# Patient Record
Sex: Female | Born: 1960 | Race: White | Hispanic: No | Marital: Married | Smoking: Current every day smoker
Health system: Southern US, Community
[De-identification: ages and names within clinical notes are randomized; demographics above are authoritative.]

## PROBLEM LIST (undated history)

## (undated) DIAGNOSIS — I779 Disorder of arteries and arterioles, unspecified: Secondary | ICD-10-CM

## (undated) DIAGNOSIS — J4530 Mild persistent asthma, uncomplicated: Secondary | ICD-10-CM

## (undated) DIAGNOSIS — E119 Type 2 diabetes mellitus without complications: Secondary | ICD-10-CM

## (undated) DIAGNOSIS — Z973 Presence of spectacles and contact lenses: Secondary | ICD-10-CM

## (undated) DIAGNOSIS — S86012A Strain of left Achilles tendon, initial encounter: Secondary | ICD-10-CM

## (undated) DIAGNOSIS — D259 Leiomyoma of uterus, unspecified: Secondary | ICD-10-CM

## (undated) DIAGNOSIS — R112 Nausea with vomiting, unspecified: Secondary | ICD-10-CM

## (undated) DIAGNOSIS — F419 Anxiety disorder, unspecified: Secondary | ICD-10-CM

## (undated) DIAGNOSIS — Z8673 Personal history of transient ischemic attack (TIA), and cerebral infarction without residual deficits: Secondary | ICD-10-CM

## (undated) DIAGNOSIS — N200 Calculus of kidney: Secondary | ICD-10-CM

## (undated) DIAGNOSIS — Z87442 Personal history of urinary calculi: Secondary | ICD-10-CM

## (undated) DIAGNOSIS — Z9889 Other specified postprocedural states: Secondary | ICD-10-CM

## (undated) DIAGNOSIS — J45909 Unspecified asthma, uncomplicated: Secondary | ICD-10-CM

## (undated) DIAGNOSIS — E282 Polycystic ovarian syndrome: Secondary | ICD-10-CM

## (undated) DIAGNOSIS — I10 Essential (primary) hypertension: Secondary | ICD-10-CM

## (undated) DIAGNOSIS — J189 Pneumonia, unspecified organism: Secondary | ICD-10-CM

## (undated) DIAGNOSIS — Z9289 Personal history of other medical treatment: Secondary | ICD-10-CM

## (undated) DIAGNOSIS — G459 Transient cerebral ischemic attack, unspecified: Secondary | ICD-10-CM

## (undated) DIAGNOSIS — F329 Major depressive disorder, single episode, unspecified: Secondary | ICD-10-CM

## (undated) DIAGNOSIS — E782 Mixed hyperlipidemia: Secondary | ICD-10-CM

## (undated) DIAGNOSIS — M1990 Unspecified osteoarthritis, unspecified site: Secondary | ICD-10-CM

## (undated) DIAGNOSIS — K219 Gastro-esophageal reflux disease without esophagitis: Secondary | ICD-10-CM

## (undated) DIAGNOSIS — Z8489 Family history of other specified conditions: Secondary | ICD-10-CM

## (undated) HISTORY — PX: UPPER GI ENDOSCOPY: SHX6162

## (undated) HISTORY — DX: Essential (primary) hypertension: I10

## (undated) HISTORY — PX: UMBILICAL HERNIA REPAIR: SHX196

## (undated) HISTORY — PX: TONSILLECTOMY: SUR1361

## (undated) HISTORY — DX: Polycystic ovarian syndrome: E28.2

## (undated) HISTORY — DX: Unspecified osteoarthritis, unspecified site: M19.90

---

## 2008-09-27 ENCOUNTER — Other Ambulatory Visit: Admission: RE | Admit: 2008-09-27 | Discharge: 2008-09-27 | Payer: Self-pay | Admitting: Obstetrics and Gynecology

## 2009-04-12 ENCOUNTER — Encounter: Admission: RE | Admit: 2009-04-12 | Discharge: 2009-04-12 | Payer: Self-pay | Admitting: Obstetrics and Gynecology

## 2011-03-25 ENCOUNTER — Other Ambulatory Visit: Payer: Self-pay | Admitting: Gynecology

## 2011-03-25 ENCOUNTER — Ambulatory Visit (INDEPENDENT_AMBULATORY_CARE_PROVIDER_SITE_OTHER): Payer: BC Managed Care – PPO | Admitting: Gynecology

## 2011-03-25 DIAGNOSIS — N920 Excessive and frequent menstruation with regular cycle: Secondary | ICD-10-CM

## 2011-03-25 DIAGNOSIS — Z01419 Encounter for gynecological examination (general) (routine) without abnormal findings: Secondary | ICD-10-CM

## 2011-03-25 DIAGNOSIS — R635 Abnormal weight gain: Secondary | ICD-10-CM

## 2011-03-31 ENCOUNTER — Other Ambulatory Visit: Payer: Self-pay | Admitting: Gynecology

## 2011-03-31 ENCOUNTER — Other Ambulatory Visit: Payer: BC Managed Care – PPO

## 2011-03-31 ENCOUNTER — Other Ambulatory Visit (HOSPITAL_COMMUNITY)
Admission: RE | Admit: 2011-03-31 | Discharge: 2011-03-31 | Disposition: A | Discharge status: Home / Self Care | Payer: BC Managed Care – PPO | Source: Ambulatory Visit | Attending: Gynecology | Admitting: Gynecology

## 2011-03-31 ENCOUNTER — Ambulatory Visit (INDEPENDENT_AMBULATORY_CARE_PROVIDER_SITE_OTHER): Payer: BC Managed Care – PPO | Admitting: Gynecology

## 2011-03-31 DIAGNOSIS — N83209 Unspecified ovarian cyst, unspecified side: Secondary | ICD-10-CM

## 2011-03-31 DIAGNOSIS — Z124 Encounter for screening for malignant neoplasm of cervix: Secondary | ICD-10-CM | POA: Insufficient documentation

## 2011-03-31 DIAGNOSIS — N7013 Chronic salpingitis and oophoritis: Secondary | ICD-10-CM

## 2011-03-31 DIAGNOSIS — N920 Excessive and frequent menstruation with regular cycle: Secondary | ICD-10-CM

## 2011-06-23 ENCOUNTER — Ambulatory Visit (INDEPENDENT_AMBULATORY_CARE_PROVIDER_SITE_OTHER): Payer: BC Managed Care – PPO | Admitting: Gynecology

## 2011-06-23 DIAGNOSIS — Z30431 Encounter for routine checking of intrauterine contraceptive device: Secondary | ICD-10-CM

## 2011-07-16 ENCOUNTER — Encounter: Payer: Self-pay | Admitting: Anesthesiology

## 2011-07-23 ENCOUNTER — Ambulatory Visit: Payer: BC Managed Care – PPO | Admitting: Gynecology

## 2012-09-03 ENCOUNTER — Other Ambulatory Visit: Payer: Self-pay | Admitting: Gynecology

## 2012-09-03 DIAGNOSIS — Z1231 Encounter for screening mammogram for malignant neoplasm of breast: Secondary | ICD-10-CM

## 2012-10-11 ENCOUNTER — Ambulatory Visit
Admission: RE | Admit: 2012-10-11 | Discharge: 2012-10-11 | Disposition: A | Discharge status: Home / Self Care | Payer: BC Managed Care – PPO | Source: Ambulatory Visit | Attending: Gynecology | Admitting: Gynecology

## 2012-10-11 ENCOUNTER — Encounter: Payer: Self-pay | Admitting: Gynecology

## 2012-10-11 ENCOUNTER — Ambulatory Visit (INDEPENDENT_AMBULATORY_CARE_PROVIDER_SITE_OTHER): Payer: BC Managed Care – PPO | Admitting: Gynecology

## 2012-10-11 ENCOUNTER — Encounter: Payer: BC Managed Care – PPO | Admitting: Gynecology

## 2012-10-11 VITALS — BP 114/78 | Ht 63.0 in | Wt 277.0 lb

## 2012-10-11 DIAGNOSIS — N7011 Chronic salpingitis: Secondary | ICD-10-CM

## 2012-10-11 DIAGNOSIS — D219 Benign neoplasm of connective and other soft tissue, unspecified: Secondary | ICD-10-CM

## 2012-10-11 DIAGNOSIS — Z1231 Encounter for screening mammogram for malignant neoplasm of breast: Secondary | ICD-10-CM

## 2012-10-11 DIAGNOSIS — Z23 Encounter for immunization: Secondary | ICD-10-CM

## 2012-10-11 DIAGNOSIS — N7013 Chronic salpingitis and oophoritis: Secondary | ICD-10-CM

## 2012-10-11 DIAGNOSIS — N83201 Unspecified ovarian cyst, right side: Secondary | ICD-10-CM

## 2012-10-11 DIAGNOSIS — N83209 Unspecified ovarian cyst, unspecified side: Secondary | ICD-10-CM

## 2012-10-11 DIAGNOSIS — I10 Essential (primary) hypertension: Secondary | ICD-10-CM | POA: Insufficient documentation

## 2012-10-11 DIAGNOSIS — Z01419 Encounter for gynecological examination (general) (routine) without abnormal findings: Secondary | ICD-10-CM

## 2012-10-11 DIAGNOSIS — E119 Type 2 diabetes mellitus without complications: Secondary | ICD-10-CM | POA: Insufficient documentation

## 2012-10-11 DIAGNOSIS — D259 Leiomyoma of uterus, unspecified: Secondary | ICD-10-CM

## 2012-10-11 NOTE — Progress Notes (Signed)
Lydia [name] [birth year] [id number]   History:    51 y.o.  for ann2ual gyn exam with history of fibroid uterus. She had a Mirena IUD placed in 2012 to help with her dysmenorrhea and menorrhagia and states her cycles are very light infrequent. Review of her record indicated that in April 2012 her ultrasound demonstrated that she had 3 intramural myomas the largest one measuring 39 x 47 x 50 mm. She did have a right echo-free cyst measuring 35 x 18 x 29 mm negative color flow. Left ovary had a tubular cystic mass negative color flow measuring 26 x 27 x 12 mm and her sonohysterogram showed no intracavitary defect. Patient also had had an endometrial biopsy the week prior and had a benign endometrium and then shortly thereafter had the Mirena IUD placed. She did not followup after the IUD was placed or for followup ultrasound on the ovarian cyst. Patient had a normal Pap smear in 2012. Her mammogram was earlier today. She frequently does her self breast examination.  Past medical history,surgical history, family history and social history were all reviewed and documented in the EPIC chart.  Gynecologic History No LMP recorded. Patient is not currently having periods (Reason: IUD). Contraception: IUD Last Pap: 2012. Results were: normal Last mammogram: 2013. Results were: Done today results pending  Obstetric History OB History    Grav Para Term Preterm Abortions TAB SAB Ect Mult Living   1 1  1      1      # Outc Date GA Lbr Len/2nd Wgt Sex Del Anes PTL Lv   1 PRE     M CS  Yes Yes       ROS: A ROS was performed and pertinent positives and negatives are included in the history.  GENERAL: No fevers or chills. HEENT: No change in vision, no earache, sore throat or sinus congestion. NECK: No pain or stiffness. CARDIOVASCULAR: No chest pain or pressure. No palpitations. PULMONARY: No shortness of breath, cough or wheeze. GASTROINTESTINAL: No abdominal pain, nausea, vomiting or diarrhea, melena or  bright red blood per rectum. GENITOURINARY: No urinary frequency, urgency, hesitancy or dysuria. MUSCULOSKELETAL: No joint or muscle pain, no back pain, no recent trauma. DERMATOLOGIC: No rash, no itching, no lesions. ENDOCRINE: No polyuria, polydipsia, no heat or cold intolerance. No recent change in weight. HEMATOLOGICAL: No anemia or easy bruising or bleeding. NEUROLOGIC: No headache, seizures, numbness, tingling or weakness. PSYCHIATRIC: No depression, no loss of interest in normal activity or change in sleep pattern.     Exam: chaperone present  BP 114/78  Ht 5\' 3"  (1.6 m)  Wt 277 lb (125.646 kg)  BMI 49.07 kg/m2  Body mass index is 49.07 kg/(m^2).  General appearance : Well developed well nourished female. No acute distress HEENT: Neck supple, trachea midline, no carotid bruits, no thyroidmegaly Lungs: Clear to auscultation, no rhonchi or wheezes, or rib retractions  Heart: Regular rate and rhythm, no murmurs or gallops Breast:Examined in sitting and supine position were symmetrical in appearance, no palpable masses or tenderness,  no skin retraction, no nipple inversion, no nipple discharge, no skin discoloration, no axillary or supraclavicular lymphadenopathy Abdomen: no palpable masses or tenderness, no rebound or guarding Extremities: no edema or skin discoloration or tenderness  Pelvic:  Bartholin, Urethra, Skene Glands: Within normal limits             Vagina: No gross lesions or discharge  Cervix: No gross lesions or discharge, IUD string seen  Uterus  six-day week size slightly irregular limited due to patient's abdominal girth  Adnexa  limited due to patient's abdominal girth Anus and perineum  normal   Rectovaginal  normal sphincter tone without palpated masses or tenderness             Hemoccult not done     Assessment/Plan:  51 y.o. female for annual exam will return to the office in 2 weeks for followup overdue ultrasound (fibroids, right ovarian cyst, hydrosalpinx  left?. We discussed the new Pap smear screening guidelines so she will not receive one today. She had the flu vaccine today. Her primary physician in [place] did her lab work recently. There monitor her for her hypertension and type 2 diabetes. She was encouraged to continue to do her monthly self breast examination. We discussed importance of calcium vitamin D for osteoporosis prevention.    Ok Edwards MD, 1:34 PM 10/11/2012

## 2012-10-11 NOTE — Patient Instructions (Signed)

## 2012-10-29 ENCOUNTER — Ambulatory Visit (INDEPENDENT_AMBULATORY_CARE_PROVIDER_SITE_OTHER): Payer: BC Managed Care – PPO | Admitting: Gynecology

## 2012-10-29 ENCOUNTER — Ambulatory Visit (INDEPENDENT_AMBULATORY_CARE_PROVIDER_SITE_OTHER): Payer: BC Managed Care – PPO

## 2012-10-29 ENCOUNTER — Encounter: Payer: Self-pay | Admitting: Gynecology

## 2012-10-29 DIAGNOSIS — Z8742 Personal history of other diseases of the female genital tract: Secondary | ICD-10-CM | POA: Insufficient documentation

## 2012-10-29 DIAGNOSIS — N7013 Chronic salpingitis and oophoritis: Secondary | ICD-10-CM

## 2012-10-29 DIAGNOSIS — D259 Leiomyoma of uterus, unspecified: Secondary | ICD-10-CM

## 2012-10-29 DIAGNOSIS — N83201 Unspecified ovarian cyst, right side: Secondary | ICD-10-CM

## 2012-10-29 DIAGNOSIS — D219 Benign neoplasm of connective and other soft tissue, unspecified: Secondary | ICD-10-CM

## 2012-10-29 DIAGNOSIS — N83209 Unspecified ovarian cyst, unspecified side: Secondary | ICD-10-CM

## 2012-10-29 DIAGNOSIS — N7011 Chronic salpingitis: Secondary | ICD-10-CM

## 2012-10-29 NOTE — Progress Notes (Signed)
Patient is a 51 year old who was seen in the office on November 4 for her annual exam see previous note for details. Review of her record indicated that in April 2012 her ultrasound demonstrated that she had 3 intramural myomas the largest one measuring 39 x 47 x 50 mm. She did have a right echo-free cyst measuring 35 x 18 x 29 mm negative color flow. Left ovary had a tubular cystic mass negative color flow measuring 26 x 27 x 12 mm and her sonohysterogram showed no intracavitary defect. Patient also had had an endometrial biopsy the week prior and had a benign endometrium and then shortly thereafter had the Mirena IUD placed. She did not followup after the IUD was placed or for followup ultrasound on the ovarian cyst. She is here for followup ultrasound.  Ultrasound today: Uterus measured 9.2 x 5.6 x 4.8 cm with endometrial stripe of 5.1 mm. Multiple small fibroids the largest one measuring 19 x 12 mm. Both ovaries were normal. IUD was seen in normal position.  Assessment/plan: Patient with prior history of right ovarian cyst and left tubular cystic structure as described above in April 2012 completely resolved. Patient scheduled followup with her internist in January. Patient has history of PCO S. and hypertension. 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 We will otherwise see her back next year or when necessary.

## 2013-02-10 ENCOUNTER — Encounter (HOSPITAL_COMMUNITY): Payer: Self-pay

## 2013-02-10 ENCOUNTER — Inpatient Hospital Stay (HOSPITAL_COMMUNITY): Payer: BC Managed Care – PPO

## 2013-02-10 ENCOUNTER — Emergency Department (HOSPITAL_COMMUNITY): Payer: BC Managed Care – PPO

## 2013-02-10 ENCOUNTER — Observation Stay (HOSPITAL_COMMUNITY)
Admission: EM | Admit: 2013-02-10 | Discharge: 2013-02-11 | DRG: 832 | Disposition: A | Discharge status: Home / Self Care | Payer: BC Managed Care – PPO | Attending: Family Medicine | Admitting: Family Medicine

## 2013-02-10 DIAGNOSIS — M1710 Unilateral primary osteoarthritis, unspecified knee: Secondary | ICD-10-CM | POA: Diagnosis present

## 2013-02-10 DIAGNOSIS — E119 Type 2 diabetes mellitus without complications: Secondary | ICD-10-CM

## 2013-02-10 DIAGNOSIS — Z79899 Other long term (current) drug therapy: Secondary | ICD-10-CM

## 2013-02-10 DIAGNOSIS — IMO0001 Reserved for inherently not codable concepts without codable children: Secondary | ICD-10-CM | POA: Diagnosis present

## 2013-02-10 DIAGNOSIS — F329 Major depressive disorder, single episode, unspecified: Secondary | ICD-10-CM | POA: Diagnosis present

## 2013-02-10 DIAGNOSIS — E282 Polycystic ovarian syndrome: Secondary | ICD-10-CM | POA: Diagnosis present

## 2013-02-10 DIAGNOSIS — Z6841 Body Mass Index (BMI) 40.0 and over, adult: Secondary | ICD-10-CM

## 2013-02-10 DIAGNOSIS — M1990 Unspecified osteoarthritis, unspecified site: Secondary | ICD-10-CM

## 2013-02-10 DIAGNOSIS — F3289 Other specified depressive episodes: Secondary | ICD-10-CM | POA: Diagnosis present

## 2013-02-10 DIAGNOSIS — Z87891 Personal history of nicotine dependence: Secondary | ICD-10-CM

## 2013-02-10 DIAGNOSIS — G459 Transient cerebral ischemic attack, unspecified: Principal | ICD-10-CM | POA: Diagnosis present

## 2013-02-10 DIAGNOSIS — I10 Essential (primary) hypertension: Secondary | ICD-10-CM

## 2013-02-10 DIAGNOSIS — E785 Hyperlipidemia, unspecified: Secondary | ICD-10-CM

## 2013-02-10 LAB — APTT: aPTT: 29 seconds (ref 24–37)

## 2013-02-10 LAB — CBC
MCHC: 35.9 g/dL (ref 30.0–36.0)
Platelets: 287 10*3/uL (ref 150–400)
RDW: 12.7 % (ref 11.5–15.5)
WBC: 10.8 10*3/uL — ABNORMAL HIGH (ref 4.0–10.5)

## 2013-02-10 LAB — COMPREHENSIVE METABOLIC PANEL
ALT: 16 U/L (ref 0–35)
AST: 12 U/L (ref 0–37)
Albumin: 3.7 g/dL (ref 3.5–5.2)
Alkaline Phosphatase: 83 U/L (ref 39–117)
Chloride: 96 mEq/L (ref 96–112)
Creatinine, Ser: 0.7 mg/dL (ref 0.50–1.10)
Potassium: 4.2 mEq/L (ref 3.5–5.1)
Sodium: 134 mEq/L — ABNORMAL LOW (ref 135–145)
Total Bilirubin: 0.4 mg/dL (ref 0.3–1.2)

## 2013-02-10 LAB — POCT I-STAT, CHEM 8
BUN: 19 mg/dL (ref 6–23)
Creatinine, Ser: 0.7 mg/dL (ref 0.50–1.10)
Glucose, Bld: 191 mg/dL — ABNORMAL HIGH (ref 70–99)
Sodium: 135 mEq/L (ref 135–145)
TCO2: 27 mmol/L (ref 0–100)

## 2013-02-10 LAB — DIFFERENTIAL
Basophils Absolute: 0 10*3/uL (ref 0.0–0.1)
Basophils Relative: 0 % (ref 0–1)
Lymphocytes Relative: 35 % (ref 12–46)
Neutro Abs: 5.8 10*3/uL (ref 1.7–7.7)
Neutrophils Relative %: 54 % (ref 43–77)

## 2013-02-10 LAB — PROTIME-INR: INR: 0.97 (ref 0.00–1.49)

## 2013-02-10 LAB — ETHANOL: Alcohol, Ethyl (B): <11 mg/dL (ref 0–11)

## 2013-02-10 LAB — POCT I-STAT TROPONIN I

## 2013-02-10 LAB — GLUCOSE, CAPILLARY: Glucose-Capillary: 254 mg/dL — ABNORMAL HIGH (ref 70–99)

## 2013-02-10 MED ORDER — HEPARIN SODIUM (PORCINE) 5000 UNIT/ML IJ SOLN
5000.0000 [IU] | Freq: Three times a day (TID) | INTRAMUSCULAR | Status: DC
  Administered 2013-02-10 – 2013-02-11 (×2): 5000 [IU] via SUBCUTANEOUS
  Filled 2013-02-10 (×5): qty 1

## 2013-02-10 MED ORDER — CITALOPRAM HYDROBROMIDE 20 MG PO TABS
20.0000 mg | ORAL_TABLET | Freq: Every day | ORAL | Status: DC
  Administered 2013-02-10 – 2013-02-11 (×2): 20 mg via ORAL
  Filled 2013-02-10 (×2): qty 1

## 2013-02-10 MED ORDER — ASPIRIN EC 81 MG PO TBEC
81.0000 mg | DELAYED_RELEASE_TABLET | Freq: Every day | ORAL | Status: DC
  Administered 2013-02-10 – 2013-02-11 (×2): 81 mg via ORAL
  Filled 2013-02-10 (×3): qty 1

## 2013-02-10 MED ORDER — ATORVASTATIN CALCIUM 20 MG PO TABS
20.0000 mg | ORAL_TABLET | Freq: Every evening | ORAL | Status: DC
  Administered 2013-02-10: 20 mg via ORAL
  Filled 2013-02-10 (×2): qty 1

## 2013-02-10 MED ORDER — SODIUM CHLORIDE 0.9 % IV SOLN: 250.0000 mL | INTRAVENOUS | Status: DC | PRN

## 2013-02-10 MED ORDER — HYDROCHLOROTHIAZIDE 25 MG PO TABS
25.0000 mg | ORAL_TABLET | Freq: Every day | ORAL | Status: DC
  Administered 2013-02-10 – 2013-02-11 (×2): 25 mg via ORAL
  Filled 2013-02-10 (×2): qty 1

## 2013-02-10 MED ORDER — INSULIN ASPART 100 UNIT/ML ~~LOC~~ SOLN
0.0000 [IU] | Freq: Three times a day (TID) | SUBCUTANEOUS | Status: DC
  Administered 2013-02-11: 5 [IU] via SUBCUTANEOUS

## 2013-02-10 MED ORDER — DICLOFENAC SODIUM 75 MG PO TBEC
75.0000 mg | DELAYED_RELEASE_TABLET | Freq: Two times a day (BID) | ORAL | Status: DC
  Administered 2013-02-11: 75 mg via ORAL
  Filled 2013-02-10 (×3): qty 1

## 2013-02-10 MED ORDER — SODIUM CHLORIDE 0.9 % IJ SOLN: 3.0000 mL | INTRAMUSCULAR | Status: DC | PRN

## 2013-02-10 MED ORDER — ACETAMINOPHEN 325 MG PO TABS
650.0000 mg | ORAL_TABLET | ORAL | Status: DC | PRN
  Administered 2013-02-10 (×2): 650 mg via ORAL
  Filled 2013-02-10 (×2): qty 2

## 2013-02-10 MED ORDER — SODIUM CHLORIDE 0.9 % IJ SOLN
3.0000 mL | Freq: Two times a day (BID) | INTRAMUSCULAR | Status: DC
  Administered 2013-02-10 – 2013-02-11 (×2): 3 mL via INTRAVENOUS

## 2013-02-10 MED ORDER — LISINOPRIL-HYDROCHLOROTHIAZIDE 20-25 MG PO TABS
1.0000 | ORAL_TABLET | Freq: Every day | ORAL | Status: DC
Start: 2013-02-10 — End: 2013-02-10

## 2013-02-10 MED ORDER — LISINOPRIL 20 MG PO TABS
20.0000 mg | ORAL_TABLET | Freq: Every day | ORAL | Status: DC
  Administered 2013-02-10 – 2013-02-11 (×2): 20 mg via ORAL
  Filled 2013-02-10 (×2): qty 1

## 2013-02-10 NOTE — ED Notes (Signed)
Family medicine at bedside.

## 2013-02-10 NOTE — Code Documentation (Signed)
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  [facility] was called and activated code stroke at 1250. They report she had diff saying words, BP 176/104, CBG 163.  She arrived at 41; stroke team was here at 1255; EDP exam at 1302 and cleared for CT which showed no acute abnormality.  Returned to Trauma A and NIHSS revealed a score of 0. Speech is back to baseline.  Code stroke canceled at 1330.  Discussed the POINT trial with the pt and she is interested in learning more. Research nurse notified.  Pt is aware of plan to admit for expedited w/u.  Hand-off given to ED RN.

## 2013-02-10 NOTE — ED Notes (Signed)
Arrives via EMS from work Teacher, English as a foreign language), at 1200, pt had sudden onset expressive aphasia, no slurred speech, no physical deficits, BP 176/104, now 130/80 - symptoms improving on arrival, CBG 163, 18g left hand, no pain, reports general weakness

## 2013-02-10 NOTE — H&P (Signed)
Lydia [name] is an 52 y.o. female.   History and Physical Note Family Medicine Teaching Service [name], MD Service Pager: [phone]  Chief Complaint: aphasia  HPI: Pt is a 52 yo F with PMH of DM, HTN, HLD and obesity who presented to the ED via EMS as a code stroke for expressive aphasia, which had fully resolved prior to arrival in the ED. She states after lunch she felt like she "couldn't say the words she wanted to say." She started feeling bad at the same time which she thought was hypoglycemia but her blood sugar was normal. When she was able to speak, she states the words were not slurred or mumbled and made sense. She states this has never happened before. She states her co-workers made her stick out her tongue, smile, test her strength and that was all normal. She only had one episode of feeling fatigued/heavy during this entire episode but no focal weakness or numbness. She states on recent history she has been saying the wrong words, or flipping her words.  Arrived in ED as code stroke, and her symptoms had completely resolved on arrival. Neurology evaluated patient and made recommendations. FPTS called for admission and management of medical problems.   Past Medical History  Diagnosis Date  . Diabetes mellitus   . PCOS (polycystic ovarian syndrome)   . Hypertension   . Osteoarthritis     Past Surgical History  Procedure Laterality Date  . Cesarean section  2001    Family History  Problem Relation Age of Onset  . Hypertension Father   . Cancer Father     PROSTATE  . Diabetes Paternal Aunt   . Hypertension Paternal Aunt   . Cancer Paternal Aunt     pancreatic  . Diabetes Paternal Grandmother   . Cancer Paternal Grandmother     leukemia   Social History:  reports that she quit smoking about 9 months ago. She has never used smokeless tobacco. She reports that  drinks alcohol. Her drug history is not on file.  Allergies:  Allergies  Allergen Reactions  .  Adhesive (Tape) Rash     (Not in a hospital admission)  Results for orders placed during the hospital encounter of 02/10/13 (from the past 48 hour(s))  PROTIME-INR     Status: None   Collection Time    02/10/13  1:08 PM      Result Value Range   Prothrombin Time 12.8  11.6 - 15.2 seconds   INR 0.97  0.00 - 1.49  APTT     Status: None   Collection Time    02/10/13  1:08 PM      Result Value Range   aPTT 29  24 - 37 seconds  CBC     Status: Abnormal   Collection Time    02/10/13  1:08 PM      Result Value Range   WBC 10.8 (*) 4.0 - 10.5 K/uL   RBC 5.11  3.87 - 5.11 MIL/uL   Hemoglobin 14.8  12.0 - 15.0 g/dL   HCT 14.7  82.9 - 56.2 %   MCV 80.6  78.0 - 100.0 fL   MCH 29.0  26.0 - 34.0 pg   MCHC 35.9  30.0 - 36.0 g/dL   RDW 13.0  86.5 - 78.4 %   Platelets 287  150 - 400 K/uL  DIFFERENTIAL     Status: None   Collection Time    02/10/13  1:08 PM  Result Value Range   Neutrophils Relative 54  43 - 77 %   Neutro Abs 5.8  1.7 - 7.7 K/uL   Lymphocytes Relative 35  12 - 46 %   Lymphs Abs 3.7  0.7 - 4.0 K/uL   Monocytes Relative 8  3 - 12 %   Monocytes Absolute 0.9  0.1 - 1.0 K/uL   Eosinophils Relative 3  0 - 5 %   Eosinophils Absolute 0.3  0.0 - 0.7 K/uL   Basophils Relative 0  0 - 1 %   Basophils Absolute 0.0  0.0 - 0.1 K/uL  COMPREHENSIVE METABOLIC PANEL     Status: Abnormal   Collection Time    02/10/13  1:08 PM      Result Value Range   Sodium 134 (*) 135 - 145 mEq/L   Potassium 4.2  3.5 - 5.1 mEq/L   Chloride 96  96 - 112 mEq/L   CO2 26  19 - 32 mEq/L   Glucose, Bld 191 (*) 70 - 99 mg/dL   BUN 19  6 - 23 mg/dL   Creatinine, Ser 1.61  0.50 - 1.10 mg/dL   Calcium 09.6  8.4 - 04.5 mg/dL   Total Protein 7.0  6.0 - 8.3 g/dL   Albumin 3.7  3.5 - 5.2 g/dL   AST 12  0 - 37 U/L   ALT 16  0 - 35 U/L   Alkaline Phosphatase 83  39 - 117 U/L   Total Bilirubin 0.4  0.3 - 1.2 mg/dL   GFR calc non Af Amer >90  >90 mL/min   GFR calc Af Amer >90  >90 mL/min   Comment:             The eGFR has been calculated     using the CKD EPI equation.     This calculation has not been     validated in all clinical     situations.     eGFR's persistently     <90 mL/min signify     possible Chronic Kidney Disease.  TROPONIN I     Status: None   Collection Time    02/10/13  1:08 PM      Result Value Range   Troponin I <0.30  <0.30 ng/mL   Comment:            Due to the release kinetics of cTnI,     a negative result within the first hours     of the onset of symptoms does not rule out     myocardial infarction with certainty.     If myocardial infarction is still suspected,     repeat the test at appropriate intervals.  POCT I-STAT, CHEM 8     Status: Abnormal   Collection Time    02/10/13  1:20 PM      Result Value Range   Sodium 135  135 - 145 mEq/L   Potassium 4.3  3.5 - 5.1 mEq/L   Chloride 102  96 - 112 mEq/L   BUN 19  6 - 23 mg/dL   Creatinine, Ser 4.09  0.50 - 1.10 mg/dL   Glucose, Bld 811 (*) 70 - 99 mg/dL   Calcium, Ion 9.14  7.82 - 1.23 mmol/L   TCO2 27  0 - 100 mmol/L   Hemoglobin 15.0  12.0 - 15.0 g/dL   HCT 95.6  21.3 - 08.6 %  POCT I-STAT TROPONIN I     Status: None  Collection Time    02/10/13  1:21 PM      Result Value Range   Troponin i, poc 0.00  0.00 - 0.08 ng/mL   Comment 3            Comment: Due to the release kinetics of cTnI,     a negative result within the first hours     of the onset of symptoms does not rule out     myocardial infarction with certainty.     If myocardial infarction is still suspected,     repeat the test at appropriate intervals.   Ct Head Wo Contrast  02/10/2013  *RADIOLOGY REPORT*  Clinical Data: Code stroke.  Sudden onset of expressive aphasia. History of hypertension.  History of diabetes.  CT HEAD WITHOUT CONTRAST  Technique:  Contiguous axial images were obtained from the base of the skull through the vertex without contrast.  Comparison: None.  Findings: There is no evidence for acute infarction,  intracranial hemorrhage, mass lesion, hydrocephalus, or extra-axial fluid. There is no atrophy or white matter disease.  No CT signs of proximal vascular occlusion.  The calvarium is intact.  Paranasal sinuses and mastoids are clear.  IMPRESSION: Negative exam.  Critical Value/emergent results were called by telephone at the time of interpretation on 02/10/2013 at 1:17 p.m. to Dr. Roseanne Reno, who verbally acknowledged these results.   Original Report Authenticated By: Davonna Belling, M.D.     Review of Systems  Constitutional: Negative for fever and chills.  HENT: Negative for congestion.   Eyes: Negative for double vision.  Respiratory: Negative for shortness of breath.   Cardiovascular: Negative for chest pain and palpitations.  Gastrointestinal: Negative for nausea and vomiting.  Genitourinary: Negative for dysuria.  Musculoskeletal: Positive for joint pain.  Skin: Negative for rash.  Neurological: Positive for headaches. Negative for dizziness and weakness.  Psychiatric/Behavioral: Negative for depression.   Blood pressure 134/75, pulse 80, resp. rate 18, SpO2 95.00%. Exam performed with Dr. Antony Haste Physical Exam  Constitutional: She is oriented to person, place, and time. She appears well-developed and well-nourished. No distress.  HENT:  Head: Normocephalic.  Mouth/Throat: Oropharynx is clear and moist.  Eyes: Pupils are equal, round, and reactive to light.  Neck: Normal range of motion. Neck supple.  Cardiovascular: Normal rate, regular rhythm and normal heart sounds.   No murmur heard. Respiratory: Effort normal and breath sounds normal. She has no wheezes.  GI: Soft. Bowel sounds are normal. There is no tenderness.  Obese  Musculoskeletal: Normal range of motion. She exhibits tenderness (Bilateral knee tenderness). She exhibits no edema.  Lymphadenopathy:    She has no cervical adenopathy.  Neurological: She is alert and oriented to person, place, and time. She displays normal  reflexes. No cranial nerve deficit. She exhibits normal muscle tone. Coordination normal.  Strength 5/5 upper and lower extremities. No focal sensory changes. Awake, very talkative with no obvious slurred speech or delayed speech.  Skin: Skin is warm and dry. No rash noted. She is not diaphoretic.    Assessment/Plan 52 yo F presenting with transient aphasia   # TIA- Patient initially a code stroke. Symptoms fully resolved. Neurology consulted in the ED. - Admit to telemetry, Dr. Gwendolyn Grant - CT scan completed which was normal - Hypercoagulability panel has been ordered since she is under the age of 34 - MRI/MRA of brain without consult (Ordered while in ED, but not yet performed) - A1C add on to current labs - Lipid panel in the  AM - Echo - Carotid dopplers - PT/OT/SLP consults - Continue q4 hours neuro checks - Started on ASA 81mg  for anti-platelet therapy  # DM- On Januvia and Metformin at home - Resistant SSI with CBG qac and qhs  # HTN- B/P stable. - Continue home Lisinopril and HCTZ - Vitals per floor protocol  # HLD- Recently started on Lipitor - Continue Lipitor 20mg  - Fasting lipid panel in the AM  # Osteoarthritis- Pain in knees. May start to bother her with PT/OT - Ordered Tylenol prn for pain  # Depression- Stable - Continue home Celexa  FEN/GI- NPO until cleared by bedside swallow screen, then carb modified diet. IVF to saline lock PPx- Heparin SQ Dispo- Pending further work-up. Patient updated on plan at admission.   HAIRFORD, AMBER 02/10/2013, 2:28 PM  Seen and examined.  Agree with Dr. Mikel Cella, the admit, her documentation and management.  Briefly, 52 yo female with TIA sx of mod aphasia (not dysarthria.)  Resolved and TIA WU neg.  I do think it is prudent to treat as a TIA with ASA and careful attention to DM, HBP and hypercholesterolemia.  OK to DC today.

## 2013-02-10 NOTE — ED Provider Notes (Signed)
History     CSN: [id number]  Arrival date & time 02/10/13  1301   First MD Initiated Contact with Patient 02/10/13 1317      Chief Complaint  Patient presents with  . Code Stroke    (Consider location/radiation/quality/duration/timing/severity/associated sxs/prior treatment) HPI This 52 year old female had sudden onset of expressive aphasia approximately 45 minutes prior to arrival and now that she is arrived in the emergency department and her CT scan her speech is now back to normal and is resolved her any aphasia. She never had receptive aphasia she no headache no altered mental status no lateralizing or focal weakness or numbness or incoordination or change in vision swallowing or understanding. Her expressive aphasia was transient and is now resolved. There was no treatment prior to arrival other than activation of a code stroke. Past Medical History  Diagnosis Date  . Diabetes mellitus   . PCOS (polycystic ovarian syndrome)   . Hypertension   . Osteoarthritis     Past Surgical History  Procedure Laterality Date  . Cesarean section  2001    Family History  Problem Relation Age of Onset  . Hypertension Father   . Cancer Father     PROSTATE  . Diabetes Paternal Aunt   . Hypertension Paternal Aunt   . Cancer Paternal Aunt     pancreatic  . Diabetes Paternal Grandmother   . Cancer Paternal Grandmother     leukemia    History  Substance Use Topics  . Smoking status: Former Smoker    Quit date: 05/11/2012  . Smokeless tobacco: Never Used  . Alcohol Use: Yes    OB History   Grav Para Term Preterm Abortions TAB SAB Ect Mult Living   1 1  1      1       Review of Systems 10 Systems reviewed and are negative for acute change except as noted in the HPI. Allergies  Adhesive  Home Medications   No current outpatient prescriptions on file.  BP 150/84  Pulse 79  Temp(Src) 98.5 F (36.9 C) (Oral)  Resp 18  Ht 5\' 4"  (1.626 m)  Wt 280 lb 13.9 oz (127.4 kg)   BMI 48.19 kg/m2  SpO2 99%  Physical Exam  Nursing note and vitals reviewed. Constitutional:  Awake, alert, nontoxic appearance with baseline speech for patient.  HENT:  Head: Atraumatic.  Mouth/Throat: No oropharyngeal exudate.  Eyes: EOM are normal. Pupils are equal, round, and reactive to light. Right eye exhibits no discharge. Left eye exhibits no discharge.  Neck: Neck supple.  Cardiovascular: Normal rate and regular rhythm.   No murmur heard. Pulmonary/Chest: Effort normal and breath sounds normal. No stridor. No respiratory distress. She has no wheezes. She has no rales. She exhibits no tenderness.  Abdominal: Soft. Bowel sounds are normal. She exhibits no mass. There is no tenderness. There is no rebound.  Musculoskeletal: She exhibits no tenderness.  Baseline ROM, moves extremities with no obvious new focal weakness.  Lymphadenopathy:    She has no cervical adenopathy.  Neurological: She is alert.  Awake, alert, cooperative and aware of situation; motor strength 5/5 bilaterally; sensation normal to light touch bilaterally; peripheral visual fields full to confrontation; no facial asymmetry; tongue midline; major cranial nerves appear intact; no pronator drift, normal finger to nose bilaterally  Skin: No rash noted.  Psychiatric: She has a normal mood and affect.    ED Course  Procedures (including critical care time) Seen by Neuro in ED no tPA since  Sxs resolved, d/w [facility] for unassigned Obs.  ECG: Sinus rhythm, ventricular rate 77, left axis deviation, no acute ischemic changes noted, no comparison ECG immediately available Labs Reviewed  CBC - Abnormal; Notable for the following:    WBC 10.8 (*)    All other components within normal limits  COMPREHENSIVE METABOLIC PANEL - Abnormal; Notable for the following:    Sodium 134 (*)    Glucose, Bld 191 (*)    All other components within normal limits  POCT I-STAT, CHEM 8 - Abnormal; Notable for the following:    Glucose, Bld  191 (*)    All other components within normal limits  PROTIME-INR  APTT  DIFFERENTIAL  TROPONIN I  ETHANOL  ANTITHROMBIN III  URINE RAPID DRUG SCREEN (HOSP PERFORMED)  URINALYSIS, ROUTINE W REFLEX MICROSCOPIC  PROTEIN C ACTIVITY  PROTEIN C, TOTAL  PROTEIN S ACTIVITY  PROTEIN S, TOTAL  LUPUS ANTICOAGULANT PANEL  BETA-2-GLYCOPROTEIN I ABS, IGG/M/A  HOMOCYSTEINE  FACTOR 5 LEIDEN  CARDIOLIPIN ANTIBODIES, IGG, IGM, IGA  HEMOGLOBIN A1C  LIPID PANEL  POCT I-STAT TROPONIN I   Ct Head Wo Contrast  02/10/2013  *RADIOLOGY REPORT*  Clinical Data: Code stroke.  Sudden onset of expressive aphasia. History of hypertension.  History of diabetes.  CT HEAD WITHOUT CONTRAST  Technique:  Contiguous axial images were obtained from the base of the skull through the vertex without contrast.  Comparison: None.  Findings: There is no evidence for acute infarction, intracranial hemorrhage, mass lesion, hydrocephalus, or extra-axial fluid. There is no atrophy or white matter disease.  No CT signs of proximal vascular occlusion.  The calvarium is intact.  Paranasal sinuses and mastoids are clear.  IMPRESSION: Negative exam.  Critical Value/emergent results were called by telephone at the time of interpretation on 02/10/2013 at 1:17 p.m. to Dr. Roseanne Reno, who verbally acknowledged these results.   Original Report Authenticated By: Davonna Belling, M.D.    Mr [facility] Wo Contrast  02/10/2013  *RADIOLOGY REPORT*  Clinical Data:  TIA.  Episode of expressive aphasia.  MRI HEAD WITHOUT CONTRAST MRA HEAD WITHOUT CONTRAST  Technique:  Multiplanar, multiecho pulse sequences of the brain and surrounding structures were obtained without intravenous contrast. Angiographic images of the head were obtained using MRA technique without contrast.  Comparison:  CT head without contrast 02/10/2013  MRI HEAD  Findings:  The diffusion weighted images demonstrate no evidence for acute or subacute infarction.  Midline structures are within normal  limits.  The ventricles are normal size.  No significant extra-axial fluid collection is present.  Flow is present in the major intracranial arteries.  The globes and orbits are intact. Minimal mucosal thickening is present in the maxillary sinuses and ethmoid air cells bilaterally.  The frontal sinuses and sphenoid sinuses are clear.  The mastoid air cells are clear.  IMPRESSION:  1.  Minimal maxillary and ethmoid sinus disease. 2.  Normal MRI appearance of the brain.  MRA HEAD  Findings: The internal carotid arteries are within normal limits from high cervical segments through the ICA termini bilaterally. The left A1 segment is dominant.  The right A1 segment is hypoplastic or stenotic.  Both A2 segments fill.  The anterior communicating artery is patent.  ACA and MCA branch vessels are within normal limits.  The left vertebral artery is slightly dominant to the right.  The AICA vessels are dominant bilaterally.  The basilar artery is within normal limits. The posterior cerebral arteries both originate from the basilar tip.  IMPRESSION:  1.  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 2.  Otherwise normal variant MRA circle of Willis without evidence for significant proximal stenosis, aneurysm, or branch vessel occlusion.   Original Report Authenticated By: Marin Ortloff, M.D.    Mr Brain Wo Contrast  02/10/2013  *RADIOLOGY REPORT*  Clinical Data:  TIA.  Episode of expressive aphasia.  MRI HEAD WITHOUT CONTRAST MRA HEAD WITHOUT CONTRAST  Technique:  Multiplanar, multiecho pulse sequences of the brain and surrounding structures were obtained without intravenous contrast. Angiographic images of the head were obtained using MRA technique without contrast.  Comparison:  CT head without contrast 02/10/2013  MRI HEAD  Findings:  The diffusion weighted images demonstrate no evidence for acute or  subacute infarction.  Midline structures are within normal limits.  The ventricles are normal size.  No significant extra-axial fluid collection is present.  Flow is present in the major intracranial arteries.  The globes and orbits are intact. Minimal mucosal thickening is present in the maxillary sinuses and ethmoid air cells bilaterally.  The frontal sinuses and sphenoid sinuses are clear.  The mastoid air cells are clear.  IMPRESSION:  1.  Minimal maxillary and ethmoid sinus disease. 2.  Normal MRI appearance of the brain.  MRA HEAD  Findings: The internal carotid arteries are within normal limits from high cervical segments through the ICA termini bilaterally. The left A1 segment is dominant.  The right A1 segment is hypoplastic or stenotic.  Both A2 segments fill.  The anterior communicating artery is patent.  ACA and MCA branch vessels are within normal limits.  The left vertebral artery is slightly dominant to the right.  The AICA vessels are dominant bilaterally.  The basilar artery is within normal limits. The posterior cerebral arteries both originate from the basilar tip.  IMPRESSION:  1.  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 2.  Otherwise normal variant MRA circle of Willis without evidence for significant proximal stenosis, aneurysm, or branch vessel occlusion.   Original Report Authenticated By: Marin Hassell, M.D.      1. TIA (transient ischemic attack)   2. Diabetes mellitus, type II   3. HTN (hypertension), benign   4. Hyperlipidemia   5. Osteoarthritis       MDM  Pt stable in ED with no significant deterioration in condition.  Patient informed of clinical course, understand medical decision-making process, and agree with plan.         Hurman Horn, MD 02/10/13 2133

## 2013-02-10 NOTE — Consult Note (Signed)
Referring Physician: Fonnie Jarvis    Chief Complaint: stroke  HPI:                                                                                                                                         Lydia [name] is an 52 y.o. female who was at lunch when she noted difficulty getting her thoughts out into coherent sentences.  She understood the people around her but note word finding difficulty. EMS was called and patient was brought to ED as a code stroke.  By time of arrival her speech had fully cleared and she was showing no localizing or lateralizing symptoms.  CBG 168.   LSN: 1200 tPA Given: No: resolution of symptoms NIHSS 0  Past Medical History  Diagnosis Date  . Diabetes mellitus   . PCOS (polycystic ovarian syndrome)   . Hypertension   . Osteoarthritis     Past Surgical History  Procedure Laterality Date  . Cesarean section  2001    Family History  Problem Relation Age of Onset  . Hypertension Father   . Cancer Father     PROSTATE  . Diabetes Paternal Aunt   . Hypertension Paternal Aunt   . Cancer Paternal Aunt     pancreatic  . Diabetes Paternal Grandmother   . Cancer Paternal Grandmother     leukemia   Social History:  reports that she quit smoking about 9 months ago. She has never used smokeless tobacco. She reports that  drinks alcohol. Her drug history is not on file.  Allergies: No Known Allergies  Medications:                                                                                                                           No current facility-administered medications for this encounter.   Current Outpatient Prescriptions  Medication Sig Dispense Refill  . citalopram (CELEXA) 20 MG tablet Take 20 mg by mouth daily.        . diclofenac (VOLTAREN) 75 MG EC tablet Take 75 mg by mouth 2 (two) times daily.      Marland Kitchen glyBURIDE-metformin (GLUCOVANCE) 5-500 MG per tablet Take 1 tablet by mouth daily with breakfast.        . levonorgestrel (MIRENA) 20  MCG/24HR IUD 1 each by Intrauterine route once.      Marland Kitchen LISINOPRIL-HYDROCHLOROTHIAZIDE PO Take 20-25  mg by mouth daily.        . sitaGLIPtin (JANUVIA) 100 MG tablet Take 100 mg by mouth daily.        ROS:                                                                                                                                       History obtained from the patient  General ROS: negative for - chills, fatigue, fever, night sweats, weight gain or weight loss Psychological ROS: negative for - behavioral disorder, hallucinations, memory difficulties, mood swings or suicidal ideation Ophthalmic ROS: negative for - blurry vision, double vision, eye pain or loss of vision ENT ROS: negative for - epistaxis, nasal discharge, oral lesions, sore throat, tinnitus or vertigo Allergy and Immunology ROS: negative for - hives or itchy/watery eyes Hematological and Lymphatic ROS: negative for - bleeding problems, bruising or swollen lymph nodes Endocrine ROS: negative for - galactorrhea, hair pattern changes, polydipsia/polyuria or temperature intolerance Respiratory ROS: negative for - cough, hemoptysis, shortness of breath or wheezing Cardiovascular ROS: negative for - chest pain, dyspnea on exertion, edema or irregular heartbeat Gastrointestinal ROS: negative for - abdominal pain, diarrhea, hematemesis, nausea/vomiting or stool incontinence Genito-Urinary ROS: negative for - dysuria, hematuria, incontinence or urinary frequency/urgency Musculoskeletal ROS: negative for - joint swelling or muscular weakness Neurological ROS: as noted in HPI Dermatological ROS: negative for rash and skin lesion changes  Neurologic Examination:                                                                                                      Blood pressure 134/75, pulse 80, resp. rate 18, SpO2 95.00%.  Mental Status: Alert, oriented, thought content appropriate.  Speech fluent without evidence of aphasia.  Able to  follow 3 step commands without difficulty. Cranial Nerves: II: Discs flat bilaterally; Visual fields grossly normal, pupils equal, round, reactive to light and accommodation III,IV, VI: ptosis not present, extra-ocular motions intact bilaterally V,VII: smile symmetric, facial light touch sensation normal bilaterally VIII: hearing normal bilaterally IX,X: gag reflex present XI: bilateral shoulder shrug XII: midline tongue extension Motor: Right : Upper extremity   5/5    Left:     Upper extremity   5/5  Lower extremity   5/5     Lower extremity   5/5 Tone and bulk:normal tone throughout; no atrophy noted Sensory: Pinprick and light touch intact throughout, bilaterally Deep Tendon Reflexes: 2+ and symmetric throughout UE and bilateral KJ. No AJ.  Plantars: Right: downgoing   Left: downgoing Cerebellar: normal finger-to-nose,  normal heel-to-shin test CV: pulses palpable throughout    Results for orders placed during the hospital encounter of 02/10/13 (from the past 48 hour(s))  POCT I-STAT, CHEM 8     Status: Abnormal   Collection Time    02/10/13  1:20 PM      Result Value Range   Sodium 135  135 - 145 mEq/L   Potassium 4.3  3.5 - 5.1 mEq/L   Chloride 102  96 - 112 mEq/L   BUN 19  6 - 23 mg/dL   Creatinine, Ser 9.60  0.50 - 1.10 mg/dL   Glucose, Bld 454 (*) 70 - 99 mg/dL   Calcium, Ion 0.98  1.19 - 1.23 mmol/L   TCO2 27  0 - 100 mmol/L   Hemoglobin 15.0  12.0 - 15.0 g/dL   HCT 14.7  82.9 - 56.2 %   Ct Head Wo Contrast  02/10/2013  *RADIOLOGY REPORT*  Clinical Data: Code stroke.  Sudden onset of expressive aphasia. History of hypertension.  History of diabetes.  CT HEAD WITHOUT CONTRAST  Technique:  Contiguous axial images were obtained from the base of the skull through the vertex without contrast.  Comparison: None.  Findings: There is no evidence for acute infarction, intracranial hemorrhage, mass lesion, hydrocephalus, or extra-axial fluid. There is no atrophy or white matter  disease.  No CT signs of proximal vascular occlusion.  The calvarium is intact.  Paranasal sinuses and mastoids are clear.  IMPRESSION: Negative exam.  Critical Value/emergent results were called by telephone at the time of interpretation on 02/10/2013 at 1:17 p.m. to Dr. Roseanne Reno, who verbally acknowledged these results.   Original Report Authenticated By: Davonna Belling, M.D.     Assessment and plan discussed with with attending physician and they are in agreement.    Felicie Morn PA-C [facility] [phone]  02/10/2013, 1:34 PM   Assessment: 52 y.o. female  With transient word finding difficulty which fully cleared prior to arrival in EDP. Likely TIA.  Patient to be admitted to hospital by hospitalist for TIA workup and hypercoagulable panel as she is <50 YO.  Stroke Risk Factors - diabetes mellitus and hypertension  Plan: 1. HgbA1c, fasting lipid panel 2. MRI, MRA  of the brain without contrast 3. PT consult, OT consult, Speech consult 4. Echocardiogram 5. Carotid dopplers 6. Prophylactic therapy-Antiplatelet med: Aspirin - dose 81 mg daily 7. Risk factor modification 8. Telemetry monitoring 9. Frequent neuro checks 10 Hyper coagulable panel (oerdered while in ED)  Felicie Morn PA-C [facility]  I personally participated in this patient's evaluation and management including examination, as well as rendering the above assessment and recommendations.  Venetia Maxon M.D. [facility] [phone]

## 2013-02-10 NOTE — ED Notes (Signed)
Pt c/o headache x 45 minutes.  No disarthria or aphasia noted.  ao x 4.

## 2013-02-10 NOTE — ED Notes (Addendum)
Pt returns to trauma A, neuro MD, EDP and stroke RN to bedside

## 2013-02-10 NOTE — ED Notes (Signed)
RN on 4N to call for report.

## 2013-02-11 DIAGNOSIS — G459 Transient cerebral ischemic attack, unspecified: Secondary | ICD-10-CM

## 2013-02-11 DIAGNOSIS — I369 Nonrheumatic tricuspid valve disorder, unspecified: Secondary | ICD-10-CM

## 2013-02-11 LAB — LIPID PANEL
LDL Cholesterol: 76 mg/dL (ref 0–99)
Total CHOL/HDL Ratio: 6.5 RATIO
VLDL: 72 mg/dL — ABNORMAL HIGH (ref 0–40)

## 2013-02-11 LAB — GLUCOSE, CAPILLARY: Glucose-Capillary: 246 mg/dL — ABNORMAL HIGH (ref 70–99)

## 2013-02-11 LAB — HOMOCYSTEINE: Homocysteine: 8.6 umol/L (ref 4.0–15.4)

## 2013-02-11 MED ORDER — TRIAMCINOLONE ACETONIDE 0.5 % EX OINT: TOPICAL_OINTMENT | Freq: Two times a day (BID) | CUTANEOUS | Status: DC

## 2013-02-11 MED ORDER — ASPIRIN 81 MG PO TBEC: 81.0000 mg | DELAYED_RELEASE_TABLET | Freq: Every day | ORAL | Status: DC

## 2013-02-11 MED ORDER — METFORMIN HCL 500 MG PO TABS: 500.0000 mg | ORAL_TABLET | Freq: Two times a day (BID) | ORAL | Status: DC

## 2013-02-11 NOTE — Progress Notes (Signed)
  Echocardiogram 2D Echocardiogram has been performed.  [name], Lydia 02/11/2013, 10:20 AM

## 2013-02-11 NOTE — Progress Notes (Signed)
Stroke Team Progress Note  HISTORY Lydia [name] is an 52 y.o. female who was at lunch 02/10/2013  when she noted difficulty getting her thoughts out into coherent sentences. She understood the people around her but note word finding difficulty. EMS was called and patient was brought to ED as a code stroke. By time of arrival her speech had fully cleared and she was showing no localizing or lateralizing symptoms. CBG 168. Patient was not a TPA candidate secondary to symptom resolution. She was admitted for further evaluation and treatment.  SUBJECTIVE  No family is at the bedside.  Overall she feels her condition is completely resolved.   OBJECTIVE Most recent Vital Signs: Filed Vitals:   02/10/13 1846 02/10/13 2203 02/11/13 0250 02/11/13 0549  BP: 150/84 129/72 125/83 131/73  Pulse: 79 83 78 79  Temp: 98.5 F (36.9 C) 98.1 F (36.7 C) 97.5 F (36.4 C) 98 F (36.7 C)  TempSrc: Oral Oral Oral Oral  Resp: 18 16 18 17   Height: 5\' 4"  (1.626 m)     Weight: 127.4 kg (280 lb 13.9 oz)     SpO2: 99% 96% 97% 96%   CBG (last 3)   Recent Labs  02/10/13 2206 02/11/13 0747  GLUCAP 254* 246*    IV Fluid Intake:     MEDICATIONS  . aspirin EC  81 mg Oral Daily  . atorvastatin  20 mg Oral QPM  . citalopram  20 mg Oral Daily  . diclofenac  75 mg Oral BID WC  . heparin  5,000 Units Subcutaneous Q8H  . hydrochlorothiazide  25 mg Oral Daily  . insulin aspart  0-15 Units Subcutaneous TID WC  . lisinopril  20 mg Oral Daily  . sodium chloride  3 mL Intravenous Q12H   PRN:  sodium chloride, acetaminophen, sodium chloride  Diet:  Carb Control thin liquids Activity:   Bathroom privileges with assistance DVT Prophylaxis:  Heparin 5000 units sq tid  CLINICALLY SIGNIFICANT STUDIES Basic Metabolic Panel:  Recent Labs Lab 02/10/13 1308 02/10/13 1320  NA 134* 135  K 4.2 4.3  CL 96 102  CO2 26  --   GLUCOSE 191* 191*  BUN 19 19  CREATININE 0.70 0.70  CALCIUM 10.1  --    Liver Function  Tests:  Recent Labs Lab 02/10/13 1308  AST 12  ALT 16  ALKPHOS 83  BILITOT 0.4  PROT 7.0  ALBUMIN 3.7   CBC:  Recent Labs Lab 02/10/13 1308 02/10/13 1320  WBC 10.8*  --   NEUTROABS 5.8  --   HGB 14.8 15.0  HCT 41.2 44.0  MCV 80.6  --   PLT 287  --    Coagulation:  Recent Labs Lab 02/10/13 1308  LABPROT 12.8  INR 0.97   Cardiac Enzymes:  Recent Labs Lab 02/10/13 1308  TROPONINI <0.30   Urinalysis: No results found for this basename: COLORURINE, APPERANCEUR, LABSPEC, PHURINE, GLUCOSEU, HGBUR, BILIRUBINUR, KETONESUR, PROTEINUR, UROBILINOGEN, NITRITE, LEUKOCYTESUR,  in the last 168 hours Lipid Panel    Component Value Date/Time   CHOL 175 02/11/2013 0515   TRIG 361* 02/11/2013 0515   HDL 27* 02/11/2013 0515   CHOLHDL 6.5 02/11/2013 0515   VLDL 72* 02/11/2013 0515   LDLCALC 76 02/11/2013 0515   HgbA1C  Lab Results  Component Value Date   HGBA1C 8.4* 02/10/2013    Urine Drug Screen:   No results found for this basename: labopia, cocainscrnur, labbenz, amphetmu, thcu, labbarb    Alcohol Level:  Recent  Labs Lab 02/10/13 1502  ETH <11   CT of the brain  02/10/2013   Negative exam.   MRI of the brain  02/10/2013   Minimal maxillary and ethmoid sinus disease. No acute infarct.  MRA of the brain  02/10/2013  1.  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 2.  Otherwise normal variant MRA circle of Willis without evidence for significant proximal stenosis, aneurysm, or branch vessel occlusion.   2D Echocardiogram    Carotid Doppler  No evidence of hemodynamically significant internal carotid artery stenosis. Vertebral artery flow is antegrade.   CXR    EKG  normal sinus rhythm.   Therapy Recommendations   Physical Exam   Pleasant young obese Caucasian lady not in distress.Awake alert. Afebrile. Head is nontraumatic. Neck is supple without bruit. Hearing is  normal. Cardiac exam no murmur or gallop. Lungs are clear to auscultation. Distal pulses are well felt. Neurological Exam : Awake  Alert oriented x 3. Normal speech and language.Fundi not visualized. Vsion acuity and fields appear normal.eye movements full without nystagmus. Face symmetric. Tongue midline. Normal strength, tone, reflexes and coordination. Normal sensation. Gait deferred. ASSESSMENT Ms. Lydia [name] is a 52 y.o. female presenting with expressive aphasia. Imaging confirms no acute infarct. Dx: left brain TIA. TIA felt to be thrombotic secondary to small vessel disease.  On no antiplatelets prior to admission. Now on aspirin 81 mg orally every day for secondary stroke prevention. Patient with no resultant stroke symptoms. Work up underway.  Diabetes, uncontrolled, HgbA1c 8.4 Hypertension Hyperlipidemia, LDL 76, on statin PTA, on statin now, at goal LDL < 100 Cigarette smoker, quit 9 mos ago Morbid obesity, Body mass index is 48.19 kg/(m^2).  Hospital day # 1  TREATMENT/PLAN  Continue aspirin 81 mg orally every day for secondary stroke prevention. Patient has a 10-15% risk of having another stroke over the next year, the highest risk is within 2 weeks of the most recent stroke/TIA (risk of having a stroke following a stroke or TIA is the same). Ongoing risk factor control by Primary Care Physician F/u 2D  Annie Main, MSN, RN, ANVP-BC, ANP-BC, GNP-BC [facility] Pager: [phone] 02/11/2013 10:59 AM  I have personally obtained a history, examined the patient, evaluated imaging results, and formulated the assessment and plan of care. I agree with the above.  Delia Heady, MD

## 2013-02-11 NOTE — Progress Notes (Signed)
Family Medicine Teaching Service Daily Progress Note Service Page: [phone]  Subjective: doing well, no more weirdness with her words.  Objective: Temp:  [97.5 F (36.4 C)-98.8 F (37.1 C)] 98 F (36.7 C) (03/07 0549) Pulse Rate:  [78-83] 79 (03/07 0549) Resp:  [16-18] 17 (03/07 0549) BP: (125-150)/(72-84) 131/73 mmHg (03/07 0549) SpO2:  [95 %-99 %] 96 % (03/07 0549) Weight:  [280 lb 13.9 oz (127.4 kg)] 280 lb 13.9 oz (127.4 kg) (03/06 1846) Exam: General: NAD, laying comfortably in bed Cardiovascular: rrr, no mrg appreciated Respiratory: CTAB Abdomen: s, NT, ND, +BS Extremities: no edema Neuro: 5/5 strength in bilateral UE and LE, CN intact, sensation intact, no slurring of speech  CBC BMET   Recent Labs Lab 02/10/13 1308 02/10/13 1320  WBC 10.8*  --   HGB 14.8 15.0  HCT 41.2 44.0  PLT 287  --     Recent Labs Lab 02/10/13 1308 02/10/13 1320  NA 134* 135  K 4.2 4.3  CL 96 102  CO2 26  --   BUN 19 19  CREATININE 0.70 0.70  GLUCOSE 191* 191*  CALCIUM 10.1  --      Imaging/Diagnostic Tests: Ct Head Wo Contrast  02/10/2013   IMPRESSION: Negative exam. Original Report Authenticated By: Davonna Belling, M.D.    Mr [facility] Head Wo Contrast  02/10/2013 IMPRESSION:  1.  Minimal maxillary and ethmoid sinus disease. 2.  Normal MRI appearance of the brain.  MRA HEAD  Findings: The internal carotid arteries are within normal limits from high cervical segments through the ICA termini bilaterally. The left A1 segment is dominant.  The right A1 segment is hypoplastic or stenotic.  Both A2 segments fill.  The anterior communicating artery is patent.  ACA and MCA branch vessels are within normal limits.  The left vertebral artery is slightly dominant to the right.  The AICA vessels are dominant bilaterally.  The basilar artery is within normal limits. The posterior cerebral arteries both originate from the basilar tip.  IMPRESSION:  1.  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 2.  Otherwise normal variant MRA circle of Willis without evidence for significant proximal stenosis, aneurysm, or branch vessel occlusion.   Original Report Authenticated By: Marin Woolstenhulme, M.D.    Mr Brain Wo Contrast  02/10/2013   IMPRESSION:  1.  Minimal maxillary and ethmoid sinus disease. 2.  Normal MRI appearance of the brain.    MRA HEAD  IMPRESSION:  1.  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 2.  Otherwise normal variant MRA circle of Willis without evidence for significant proximal stenosis, aneurysm, or branch vessel occlusion.   Original Report Authenticated By: Marin Barstow, M.D.    Assessment/Plan: 52 yo F presenting with transient aphasia likely related to TIA. Now here for stroke work-up.   1. TIA: Patient initially a code stroke. Symptoms fully resolved. Neurology consulted in the ED.  - CT scan completed which was normal  - Hypercoagulability panel has been ordered since she is under the age of 52-not resulted yet  - MRI/MRA of brain normal - A1C elevated - Lipid with elevated TG-recently started on  - Echo and carotid dopplers  - PT/OT/SLP consults  - Continue q4 hours neuro checks  - Started on ASA 81mg  for anti-platelet therapy  2. DM: On Januvia and Metformin at home. A1c 8.4. - Resistant SSI with CBG qac and qhs  - will need to increase dose of metformin at discharge  3. HTN: BP stable.  - Continue home Lisinopril and HCTZ  - Vitals per floor protocol   4. HLD: Recently started on Lipitor  - Continue Lipitor 20mg   - Fasting lipid panel with normal LDL   5. Osteoarthritis: Pain in knees. May start to bother her with PT/OT  - Ordered Tylenol prn for pain - continued diclofenac    6. Depression: Stable  - Continue home Celexa    FEN/GI: carb modified. IVF to saline lock  PPx: Heparin SQ  Dispo: Pending completed work-up. Potentially home later today if work-up negative.   Marikay Alar, MD 02/11/2013, 7:47 AM See my co-sign of the H & PE for my note of today.  Asymptomatic.  OK to DC.  Discussed with Dr. Birdie Sons.

## 2013-02-11 NOTE — Evaluation (Signed)
Physical Therapy  One Time Evaluation Patient Details Name: Lydia [name] MRN: [id number] DOB: 08/08/1961 Today's Date: 02/11/2013 Time: 1035-1100 PT Time Calculation (min): 25 min  PT Assessment / Plan / Recommendation Clinical Impression  Pt. was admitted for an episode of expressive aphasia while at work, which resolved by time she reached ED.  It was felt that pt. had TIA; CT of head negative for acute findings.  MRI  results pending.  She presents to PT at her baseline level of strength , balance and function .  She does not appear to have PT needs due to her being at baseline, therefore will sign off.      PT Assessment  Patent does not need any further PT services    Follow Up Recommendations  No PT follow up    Does the patient have the potential to tolerate intense rehabilitation      Barriers to Discharge        Equipment Recommendations  None recommended by PT    Recommendations for Other Services     Frequency      Precautions / Restrictions Precautions Precautions: None Restrictions Weight Bearing Restrictions: No   Pertinent Vitals/Pain Pain in both knees, at her usual baseline; no pain med needed.  Pt states she is temporarily off her diclofenac due to hospitalization .        Mobility  Bed Mobility Bed Mobility: Supine to Sit Supine to Sit: 7: Independent Sitting - Scoot to Edge of Bed: 7: Independent Details for Bed Mobility Assistance: no problems Transfers Transfers: Sit to Stand;Stand to Sit Sit to Stand: 7: Independent Stand to Sit: 7: Independent Details for Transfer Assistance: no problems Ambulation/Gait Ambulation/Gait Assistance: 7: Independent Ambulation Distance (Feet): 125 Feet Assistive device: None Ambulation/Gait Assistance Details: No overt LOB, no difficulties except noted popping sounds in knee joints which pt. says is chronic Gait Pattern: Within Functional Limits Gait velocity: normal Stairs: No    Exercises     PT  Diagnosis:    PT Problem List:   PT Treatment Interventions:     PT Goals    Visit Information  Last PT Received On: 02/11/13 Assistance Needed: +1    Subjective Data  Subjective: Pt. reports she is back to baseline in all aspects. 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 Communication Communication: No difficulties Dominant Hand: Right    Cognition  Cognition Overall Cognitive Status: Appears within functional limits for tasks assessed/performed Arousal/Alertness: Awake/alert Orientation Level: Oriented X4 / Intact Behavior During Session: WFL for tasks performed    Extremity/Trunk Assessment Right Upper Extremity Assessment RUE ROM/Strength/Tone: Within functional levels RUE Sensation: WFL - Light Touch RUE Coordination: WFL - gross/fine motor Left Upper Extremity Assessment LUE ROM/Strength/Tone: Within functional levels LUE Sensation: WFL - Light Touch LUE Coordination: WFL - gross/fine motor Right Lower Extremity Assessment RLE ROM/Strength/Tone: Within functional levels RLE Sensation: WFL - Light Touch RLE Coordination: WFL - gross/fine motor Left Lower Extremity Assessment LLE ROM/Strength/Tone: Within functional levels LLE Sensation: WFL - Light Touch LLE Coordination: WFL - gross/fine motor Trunk Assessment Trunk Assessment: Normal   Balance Balance Balance Assessed: Yes Static  Standing Balance Static Standing - Balance Support: No upper extremity supported Static Standing - Level of Assistance: 7: Independent Single  Leg Stance - Right Leg: 5 Single Leg Stance - Left Leg: 5 Dynamic Standing Balance Dynamic Standing - Balance Support: No upper extremity supported Dynamic Standing - Level of Assistance: 7: Independent Dynamic Standing - Balance Activities: Other (comment) (360 degree turn without difficulty)  End of Session PT - End of Session Activity Tolerance: Patient tolerated treatment well Patient left: in bed Nurse Communication: Mobility status  GP     Ferman Hamming 02/11/2013, 11:14 AM Weldon Picking PT Acute Rehab Services [phone] Beeper [phone]

## 2013-02-11 NOTE — Progress Notes (Signed)
*  PRELIMINARY RESULTS* Vascular Ultrasound Carotid Duplex (Doppler) has been completed.  Preliminary findings: Bilateral:  No evidence of hemodynamically significant internal carotid artery stenosis.   Vertebral artery flow is antegrade.      Farrel Demark, RDMS, RVT  02/11/2013, 9:50 AM

## 2013-02-11 NOTE — Discharge Summary (Signed)
Physician Discharge Summary  Patient ID: Lydia [name] MRN: [id number] DOB: Sep 04, 1961 Age: 52 y.o.  Admit date: 02/10/2013 Discharge date: 02/11/2013 Admitting Physician: Sanjuana Letters, MD  PCP: No primary provider on file.  Consultants: Neurology, Stroke team     Discharge Diagnosis: Active Problems:   HTN (hypertension), benign   Diabetes mellitus, type II   TIA (transient ischemic attack)   Hyperlipidemia   Osteoarthritis    Hospital Course 52 yo F presenting with transient aphasia likely related to TIA. Presented for TIA work up. .  1. TIA: patient presented as a code stroke to the ED. 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 Her speech was not slurred and people around her were able to understand what she was saying. Symptoms were resolved by the time she presented to the ED. Neurology was consulted. CT head resulted below was normal. MRI/MRA were obtained and showed no acute changes and are resulted below. Hypercoag panel was obtained and in process at time of discharge. Risk stratification labs obtained and revealed elevated A1c and elevated triglycerides, but normal LDL. Carotid dopplers were read as normal. Echo showed EF 60-65% with moderate LVH. Patient was started on ASA 81 mg. Had no recurrence of symptoms at time of discharge.  2. DM: On Januvia and Metformin at home. A1c 8.4. Was placed on resistant SSI in hospital and held home medications. Increased metformin to 500 BID at discharge.   3. HTN: BP stable. Continued home Lisinopril and HCTZ.   4. HLD: Recently started on Lipitor. Continued this. Lipid panel revealed normal LDL.    5. Osteoarthritis: Pain in knees. Ordered Tylenol prn for pain and continued home diclofenac.  6. Depression: Continued home Celexa.  Problem List 1. TIA 2. DM 3. HTN 4. HLD 5. OA 6. Depression         Discharge  PE   Filed Vitals:   02/11/13 0549  BP: 131/73  Pulse: 79  Temp: 98 F (36.7 C)  Resp: 17   General: NAD, laying comfortably in bed  Cardiovascular: rrr, no mrg appreciated  Respiratory: CTAB  Abdomen: s, NT, ND, +BS  Extremities: no edema  Neuro: 5/5 strength in bilateral UE and LE, CN intact, sensation intact, no slurring of speech   Procedures/Imaging:  Ct Head Wo Contrast  02/10/2013  IMPRESSION: Negative exam. Original Report Authenticated By: Davonna Belling, M.D.    Mr [facility] Head Wo Contrast  02/10/2013 IMPRESSION:  1.  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 2.  Otherwise normal variant MRA circle of Willis without evidence for significant proximal stenosis, aneurysm, or branch vessel occlusion.   Original Report Authenticated By: Marin Froelich, M.D.    Mr Brain Wo Contrast  02/10/2013   IMPRESSION:  1.  Minimal maxillary and ethmoid sinus disease. 2.  Normal MRI appearance of the brain. Original Report Authenticated By: Marin Nachreiner, M.D.     Labs  CBC  Recent Labs Lab 02/10/13 1308 02/10/13 1320  WBC 10.8*  --   HGB 14.8 15.0  HCT 41.2 44.0  PLT 287  --    BMET  Recent Labs Lab 02/10/13 1308 02/10/13 1320  NA 134* 135  K 4.2 4.3  CL 96 102  CO2 26  --   BUN 19 19  CREATININE 0.70 0.70  CALCIUM 10.1  --   PROT 7.0  --   BILITOT 0.4  --   ALKPHOS 83  --   ALT 16  --   AST 12  --   GLUCOSE 191* 191*   Results for orders placed during the hospital encounter of 02/10/13 (from the past 72 hour(s))  PROTIME-INR     Status: None   Collection Time    02/10/13  1:08 PM      Result Value Range   Prothrombin Time 12.8  11.6 - 15.2 seconds   INR 0.97  0.00 - 1.49  APTT     Status: None   Collection Time    02/10/13  1:08 PM      Result Value Range   aPTT 29  24 - 37 seconds  CBC     Status: Abnormal   Collection Time     02/10/13  1:08 PM      Result Value Range   WBC 10.8 (*) 4.0 - 10.5 K/uL   RBC 5.11  3.87 - 5.11 MIL/uL   Hemoglobin 14.8  12.0 - 15.0 g/dL   HCT 78.2  95.6 - 21.3 %   MCV 80.6  78.0 - 100.0 fL   MCH 29.0  26.0 - 34.0 pg   MCHC 35.9  30.0 - 36.0 g/dL   RDW 08.6  57.8 - 46.9 %   Platelets 287  150 - 400 K/uL  DIFFERENTIAL     Status: None   Collection Time    02/10/13  1:08 PM      Result Value Range   Neutrophils Relative 54  43 - 77 %   Neutro Abs 5.8  1.7 - 7.7 K/uL   Lymphocytes Relative 35  12 - 46 %   Lymphs Abs 3.7  0.7 - 4.0 K/uL   Monocytes Relative 8  3 - 12 %   Monocytes Absolute 0.9  0.1 - 1.0 K/uL   Eosinophils Relative 3  0 - 5 %   Eosinophils Absolute 0.3  0.0 - 0.7 K/uL   Basophils Relative 0  0 - 1 %   Basophils Absolute 0.0  0.0 - 0.1 K/uL  COMPREHENSIVE METABOLIC PANEL     Status: Abnormal   Collection Time    02/10/13  1:08 PM      Result Value Range   Sodium 134 (*) 135 - 145 mEq/L   Potassium 4.2  3.5 - 5.1 mEq/L   Chloride 96  96 - 112 mEq/L   CO2 26  19 - 32 mEq/L   Glucose, Bld 191 (*) 70 - 99 mg/dL   BUN 19  6 - 23 mg/dL   Creatinine, Ser 6.29  0.50 - 1.10 mg/dL   Calcium 52.8  8.4 - 41.3 mg/dL   Total Protein 7.0  6.0 - 8.3 g/dL   Albumin 3.7  3.5 - 5.2 g/dL   AST 12  0 - 37 U/L   ALT 16  0 - 35 U/L   Alkaline Phosphatase 83  39 - 117 U/L   Total Bilirubin 0.4  0.3 - 1.2 mg/dL   GFR calc non Af Amer >90  >90 mL/min   GFR calc Af Amer >90  >90 mL/min   Comment:            The eGFR has been calculated     using the CKD EPI equation.     This calculation has not been     validated in all clinical  situations.     eGFR's persistently     <90 mL/min signify     possible Chronic Kidney Disease.  TROPONIN I     Status: None   Collection Time    02/10/13  1:08 PM      Result Value Range   Troponin I <0.30  <0.30 ng/mL   Comment:            Due to the release kinetics of cTnI,     a negative result within the first hours     of the  onset of symptoms does not rule out     myocardial infarction with certainty.     If myocardial infarction is still suspected,     repeat the test at appropriate intervals.  POCT I-STAT, CHEM 8     Status: Abnormal   Collection Time    02/10/13  1:20 PM      Result Value Range   Sodium 135  135 - 145 mEq/L   Potassium 4.3  3.5 - 5.1 mEq/L   Chloride 102  96 - 112 mEq/L   BUN 19  6 - 23 mg/dL   Creatinine, Ser 4.54  0.50 - 1.10 mg/dL   Glucose, Bld 098 (*) 70 - 99 mg/dL   Calcium, Ion 1.19  1.47 - 1.23 mmol/L   TCO2 27  0 - 100 mmol/L   Hemoglobin 15.0  12.0 - 15.0 g/dL   HCT 82.9  56.2 - 13.0 %  POCT I-STAT TROPONIN I     Status: None   Collection Time    02/10/13  1:21 PM      Result Value Range   Troponin i, poc 0.00  0.00 - 0.08 ng/mL   Comment 3            Comment: Due to the release kinetics of cTnI,     a negative result within the first hours     of the onset of symptoms does not rule out     myocardial infarction with certainty.     If myocardial infarction is still suspected,     repeat the test at appropriate intervals.  ETHANOL     Status: None   Collection Time    02/10/13  3:02 PM      Result Value Range   Alcohol, Ethyl (B) <11  0 - 11 mg/dL   Comment:            LOWEST DETECTABLE LIMIT FOR     SERUM ALCOHOL IS 11 mg/dL     FOR MEDICAL PURPOSES ONLY  ANTITHROMBIN III     Status: None   Collection Time    02/10/13  3:02 PM      Result Value Range   AntiThromb III Func 87  75 - 120 %  HOMOCYSTEINE     Status: None   Collection Time    02/10/13  3:02 PM      Result Value Range   Homocysteine 8.6  4.0 - 15.4 umol/L  HEMOGLOBIN A1C     Status: Abnormal   Collection Time    02/10/13  7:00 PM      Result Value Range   Hemoglobin A1C 8.4 (*) <5.7 %   Comment: (NOTE)  According to the ADA Clinical Practice Recommendations for 2011, when     HbA1c is used as a screening test:       >=6.5%   Diagnostic of Diabetes Mellitus               (if abnormal result is confirmed)     5.7-6.4%   Increased risk of developing Diabetes Mellitus     References:Diagnosis and Classification of Diabetes Mellitus,Diabetes     Care,2011,34(Suppl 1):S62-S69 and Standards of Medical Care in             Diabetes - 2011,Diabetes Care,2011,34 (Suppl 1):S11-S61.   Mean Plasma Glucose 194 (*) <117 mg/dL  GLUCOSE, CAPILLARY     Status: Abnormal   Collection Time    02/10/13 10:06 PM      Result Value Range   Glucose-Capillary 254 (*) 70 - 99 mg/dL   Comment 1 Notify RN    LIPID PANEL     Status: Abnormal   Collection Time    02/11/13  5:15 AM      Result Value Range   Cholesterol 175  0 - 200 mg/dL   Triglycerides 213 (*) <150 mg/dL   HDL 27 (*) >08 mg/dL   Total CHOL/HDL Ratio 6.5     VLDL 72 (*) 0 - 40 mg/dL   LDL Cholesterol 76  0 - 99 mg/dL   Comment:            Total Cholesterol/HDL:CHD Risk     Coronary Heart Disease Risk Table                         Men   Women      1/2 Average Risk   3.4   3.3      Average Risk       5.0   4.4      2 X Average Risk   9.6   7.1      3 X Average Risk  23.4   11.0                Use the calculated Patient Ratio     above and the CHD Risk Table     to determine the patient's CHD Risk.                ATP III CLASSIFICATION (LDL):      <100     mg/dL   Optimal      657-846  mg/dL   Near or Above                        Optimal      130-159  mg/dL   Borderline      962-952  mg/dL   High      >841     mg/dL   Very High  GLUCOSE, CAPILLARY     Status: Abnormal   Collection Time    02/11/13  7:47 AM      Result Value Range   Glucose-Capillary 246 (*) 70 - 99 mg/dL   Comment 1 Notify RN    GLUCOSE, CAPILLARY     Status: Abnormal   Collection Time    02/11/13 11:44 AM      Result Value Range   Glucose-Capillary 181 (*) 70 - 99 mg/dL   Comment 1 Notify RN     Comment 2 Documented in Chart  Patient condition at time of  discharge/disposition: stable  Disposition-home   Follow up issues: 1. Continued aspirin therapy 2. F/u hypercoag panel 3. A1c elevated above goal, increased metformin at discharge, consider additional alteration in regimen for better DM control  Discharge follow up:  Follow-up Information   Please follow up. (Please call your primary doctor to schedule a hospital follow-up appointment for as soon as possible.)          Discharge Orders   Future Orders Complete By Expires     Call MD for:  difficulty breathing, headache or visual disturbances  As directed     Call MD for:  persistant dizziness or light-headedness  As directed     Call MD for:  persistant nausea and vomiting  As directed     Diet - low sodium heart healthy  As directed     Increase activity slowly  As directed         Discharge Instructions: Please refer to Patient Instructions section of EMR for full details.  Patient was counseled important signs and symptoms that should prompt return to medical care, changes in medications, dietary instructions, activity restrictions, and follow up appointments.    Discharge Medications   Medication List    TAKE these medications       aspirin 81 MG EC tablet  Take 1 tablet (81 mg total) by mouth daily.     atorvastatin 20 MG tablet  Commonly known as:  LIPITOR  Take 20 mg by mouth every evening.     citalopram 20 MG tablet  Commonly known as:  CELEXA  Take 20 mg by mouth daily.     diclofenac 75 MG EC tablet  Commonly known as:  VOLTAREN  Take 75 mg by mouth 2 (two) times daily.     levonorgestrel 20 MCG/24HR IUD  Commonly known as:  MIRENA  1 each by Intrauterine route once.     lisinopril-hydrochlorothiazide 20-25 MG per tablet  Commonly known as:  PRINZIDE,ZESTORETIC  Take 1 tablet by mouth daily.     metFORMIN 500 MG tablet  Commonly known as:  GLUCOPHAGE  Take 1 tablet (500 mg total) by mouth 2 (two) times daily with a meal.     sitaGLIPtin 100 MG  tablet  Commonly known as:  JANUVIA  Take 100 mg by mouth daily.     triamcinolone ointment 0.5 %  Commonly known as:  KENALOG  Apply topically 2 (two) times daily.       Marikay Alar, MD of [facility] 02/12/2013 11:24 AM

## 2013-02-11 NOTE — Evaluation (Signed)
Occupational Therapy Evaluation and Discharge Summary Patient Details Name: Lydia [name] MRN: [id number] DOB: 05-15-1961 Today's Date: 02/11/2013 Time: 5621-3086 OT Time Calculation (min): 22 min  OT Assessment / Plan / Recommendation Clinical Impression  Pt is a 52 yo female admitted for symptoms of TIA re: speech that have no resolved.  Pt is I with all adls.  No OT needs at this time.    OT Assessment  Patient does not need any further OT services    Follow Up Recommendations  No OT follow up    Barriers to Discharge      Equipment Recommendations  None recommended by OT    Recommendations for Other Services    Frequency       Precautions / Restrictions Precautions Precautions: None Restrictions Weight Bearing Restrictions: No   Pertinent Vitals/Pain Pt with chronic B knee pain.    ADL  Eating/Feeding: Simulated;Independent Where Assessed - Eating/Feeding: Chair Grooming: Simulated;Independent Where Assessed - Grooming: Unsupported standing Upper Body Bathing: Simulated;Independent Where Assessed - Upper Body Bathing: Unsupported sitting Lower Body Bathing: Simulated;Independent Where Assessed - Lower Body Bathing: Unsupported sit to stand Upper Body Dressing: Simulated;Independent Where Assessed - Upper Body Dressing: Unsupported sitting Lower Body Dressing: Simulated;Independent Where Assessed - Lower Body Dressing: Unsupported sit to stand Toilet Transfer: Performed;Independent Toilet Transfer Method: Other (comment) (ambulating) Toilet Transfer Equipment: Regular height toilet Toileting - Clothing Manipulation and Hygiene: Simulated;Independent Where Assessed - Toileting Clothing Manipulation and Hygiene: Standing Transfers/Ambulation Related to ADLs: independent ADL Comments: independent    OT Diagnosis:    OT Problem List:   OT Treatment Interventions:     OT Goals    Visit Information  Last OT Received On: 02/11/13 Assistance Needed: +1 PT/OT  Co-Evaluation/Treatment: Yes    Subjective Data  Subjective: "I feel back to normal." Patient Stated Goal: to go home today.   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 Communication Communication: No difficulties Dominant Hand: Right         Vision/Perception Vision - History Baseline Vision: No visual deficits Patient Visual Report: No change from baseline Vision - Assessment Vision Assessment: Vision not tested   Cognition  Cognition Overall Cognitive Status: Appears within functional limits for tasks assessed/performed Arousal/Alertness: Awake/alert Orientation Level: Oriented X4 / Intact Behavior During Session: [facility] for tasks performed    Extremity/Trunk Assessment Right Upper Extremity Assessment RUE ROM/Strength/Tone: Within functional levels RUE Sensation: WFL - Light Touch RUE Coordination: WFL - gross/fine motor Left Upper Extremity Assessment LUE ROM/Strength/Tone: Within functional levels LUE Sensation: WFL - Light Touch LUE Coordination: WFL - gross/fine motor Right Lower Extremity Assessment RLE ROM/Strength/Tone: Within functional levels RLE Sensation: WFL - Light Touch RLE Coordination: WFL - gross/fine motor Left Lower Extremity Assessment LLE ROM/Strength/Tone: Within functional levels LLE Sensation: WFL - Light Touch LLE Coordination: WFL - gross/fine motor Trunk Assessment Trunk Assessment: Normal     Mobility Bed Mobility Bed Mobility: Supine to Sit Supine to Sit: 7: Independent Sitting - Scoot to Edge of Bed: 7: Independent Details for Bed Mobility  Assistance: no problems Transfers Transfers: Sit to Stand;Stand to Sit Sit to Stand: 7: Independent Stand to Sit: 7: Independent  Exercise     Balance     End of Session OT - End of Session Activity Tolerance: Patient tolerated treatment well Patient left: in chair;with call bell/phone within reach Nurse Communication: Mobility status  GO     Hope Budds 02/11/2013, 11:10 AM [phone]

## 2013-02-12 NOTE — Discharge Summary (Signed)
Seen and examined.  Agree with Dr. Birdie Sons - the DC, his management and documentation.

## 2013-02-14 LAB — LUPUS ANTICOAGULANT PANEL: DRVVT: 35.8 secs (ref <42.9)

## 2013-02-14 LAB — PROTEIN S ACTIVITY: Protein S Activity: 105 % (ref 69–129)

## 2013-02-14 NOTE — Care Management Note (Signed)
    Page 1 of 1   02/14/2013     8:24:41 AM   CARE MANAGEMENT NOTE 02/14/2013  Patient:  Lydia [name], Lydia [name]   Account Number:  [id number]  Date Initiated:  02/11/2013  Documentation initiated by:  [facility]  Subjective/Objective Assessment:   admitted with aphasia, TIA workup     Action/Plan:   PT/OT evals-no follow up recommended   Anticipated DC Date:  02/14/2013   Anticipated DC Plan:  HOME/SELF CARE      DC Planning Services  CM consult      Choice offered to / List presented to:             Status of service:  Completed, signed off Medicare Important Message given?   (If response is "NO", the following Medicare IM given date fields will be blank) Date Medicare IM given:   Date Additional Medicare IM given:    Discharge Disposition:  HOME/SELF CARE  Per UR Regulation:  Reviewed for med. necessity/level of care/duration of stay  If discussed at Long Length of Stay Meetings, dates discussed:    Comments:

## 2013-02-15 LAB — CARDIOLIPIN ANTIBODIES, IGG, IGM, IGA
Anticardiolipin IgA: 4 APL U/mL — ABNORMAL LOW (ref <22)
Anticardiolipin IgG: 12 GPL U/mL (ref <23)
Anticardiolipin IgM: 2 MPL U/mL — ABNORMAL LOW (ref <11)

## 2013-02-15 LAB — BETA-2-GLYCOPROTEIN I ABS, IGG/M/A: Beta-2-Glycoprotein I IgA: 6 A Units (ref <20)

## 2013-02-17 ENCOUNTER — Telehealth: Payer: Self-pay | Admitting: Family Medicine

## 2013-02-17 MED ORDER — ASPIRIN 81 MG PO TBEC: 81.0000 mg | DELAYED_RELEASE_TABLET | Freq: Every day | ORAL | Status: DC

## 2013-02-17 MED ORDER — TRIAMCINOLONE ACETONIDE 0.5 % EX OINT: TOPICAL_OINTMENT | Freq: Two times a day (BID) | CUTANEOUS | Status: DC

## 2013-02-17 MED ORDER — METFORMIN HCL 500 MG PO TABS: 500.0000 mg | ORAL_TABLET | Freq: Two times a day (BID) | ORAL | Status: DC

## 2013-02-17 NOTE — Telephone Encounter (Signed)
Filled patients medications that didn't get transmitted to pharmacy previously. Please advise patient.

## 2013-02-18 NOTE — Telephone Encounter (Signed)
LMOM cell # that meds sent to pharmacy

## 2013-12-08 HISTORY — PX: COLONOSCOPY W/ BIOPSIES: SHX1374

## 2014-05-09 ENCOUNTER — Ambulatory Visit: Payer: BC Managed Care – PPO | Admitting: Endocrinology

## 2014-05-12 ENCOUNTER — Ambulatory Visit (INDEPENDENT_AMBULATORY_CARE_PROVIDER_SITE_OTHER): Payer: BC Managed Care – PPO | Admitting: Endocrinology

## 2014-05-12 ENCOUNTER — Encounter: Payer: Self-pay | Admitting: Endocrinology

## 2014-05-12 VITALS — BP 130/70 | HR 97 | Temp 98.5°F | Ht 64.0 in | Wt 278.0 lb

## 2014-05-12 DIAGNOSIS — E119 Type 2 diabetes mellitus without complications: Secondary | ICD-10-CM

## 2014-05-12 MED ORDER — CANAGLIFLOZIN 300 MG PO TABS: 1.0000 | ORAL_TABLET | Freq: Every day | ORAL | Status: DC

## 2014-05-12 NOTE — Progress Notes (Signed)
Subjective:    Patient ID: Lydia [name], female    DOB: 1961-05-01, 53 y.o.   MRN: [id number]  HPI pt presents for rx of type 2 DM (dx'ed in 2008; she has moderate neuropathy of the lower extremities; she has associated CAD and PAD; she has never been on insulin; she has never had GDM, pancreatitis, severe hypoglycemia or DKA; she says insurance declined weight-loss surgery). She will have bilat TKR's next year.   Past Medical History  Diagnosis Date  . Diabetes mellitus   . PCOS (polycystic ovarian syndrome)   . Hypertension   . Osteoarthritis     Past Surgical History  Procedure Laterality Date  . Cesarean section  2001    History   Social History  . Marital Status: Married    Spouse Name: N/A    Number of Children: N/A  . Years of Education: N/A   Occupational History  . Not on file.   Social History Main Topics  . Smoking status: Former Smoker    Quit date: 05/11/2012  . Smokeless tobacco: Never Used  . Alcohol Use: Yes  . Drug Use: Not on file  . Sexual Activity: Not on file   Other Topics Concern  . Not on file   Social History Narrative  . No narrative on file    Current Outpatient Prescriptions on File Prior to Visit  Medication Sig Dispense Refill  . aspirin 81 MG EC tablet Take 1 tablet (81 mg total) by mouth daily.  90 tablet  3  . atorvastatin (LIPITOR) 20 MG tablet Take 20 mg by mouth every evening.      . citalopram (CELEXA) 20 MG tablet Take 20 mg by mouth daily.        . diclofenac (VOLTAREN) 75 MG EC tablet Take 75 mg by mouth 2 (two) times daily.      Marland Kitchen levonorgestrel (MIRENA) 20 MCG/24HR IUD 1 each by Intrauterine route once.      Marland Kitchen lisinopril-hydrochlorothiazide (PRINZIDE,ZESTORETIC) 20-25 MG per tablet Take 1 tablet by mouth daily.      . metFORMIN (GLUCOPHAGE) 500 MG tablet Take 1 tablet (500 mg total) by mouth 2 (two) times daily with a meal.  60 tablet  1  . sitaGLIPtin (JANUVIA) 100 MG tablet Take 100 mg by mouth daily.      Marland Kitchen  triamcinolone ointment (KENALOG) 0.5 % Apply topically 2 (two) times daily.  30 g  0   No current facility-administered medications on file prior to visit.    Allergies  Allergen Reactions  . Adhesive [Tape] Rash    Family History  Problem Relation Age of Onset  . Hypertension Father   . Cancer Father     PROSTATE  . Diabetes Paternal Aunt   . Hypertension Paternal Aunt   . Cancer Paternal Aunt     pancreatic  . Diabetes Paternal Grandmother   . Cancer Paternal Grandmother     leukemia    BP 130/70  Pulse 97  Temp(Src) 98.5 F (36.9 C) (Oral)  Ht 5\' 4"  (1.626 m)  Wt 278 lb (126.1 kg)  BMI 47.70 kg/m2  SpO2 95%    Past Medical History  Diagnosis Date  . Diabetes mellitus   . PCOS (polycystic ovarian syndrome)   . Hypertension   . Osteoarthritis     Past Surgical History  Procedure Laterality Date  . Cesarean section  2001    History   Social History  . Marital Status: Married  Spouse Name: N/A    Number of Children: N/A  . Years of Education: N/A   Occupational History  . Not on file.   Social History Main Topics  . Smoking status: Former Smoker    Quit date: 05/11/2012  . Smokeless tobacco: Never Used  . Alcohol Use: Yes  . Drug Use: Not on file  . Sexual Activity: Not on file   Other Topics Concern  . Not on file   Social History Narrative  . No narrative on file    Current Outpatient Prescriptions on File Prior to Visit  Medication Sig Dispense Refill  . aspirin 81 MG EC tablet Take 1 tablet (81 mg total) by mouth daily.  90 tablet  3  . atorvastatin (LIPITOR) 20 MG tablet Take 20 mg by mouth every evening.      . citalopram (CELEXA) 20 MG tablet Take 20 mg by mouth daily.        . diclofenac (VOLTAREN) 75 MG EC tablet Take 75 mg by mouth 2 (two) times daily.      Marland Kitchen levonorgestrel (MIRENA) 20 MCG/24HR IUD 1 each by Intrauterine route once.      Marland Kitchen lisinopril-hydrochlorothiazide (PRINZIDE,ZESTORETIC) 20-25 MG per tablet Take 1 tablet  by mouth daily.      . metFORMIN (GLUCOPHAGE) 500 MG tablet Take 1 tablet (500 mg total) by mouth 2 (two) times daily with a meal.  60 tablet  1  . sitaGLIPtin (JANUVIA) 100 MG tablet Take 100 mg by mouth daily.      Marland Kitchen triamcinolone ointment (KENALOG) 0.5 % Apply topically 2 (two) times daily.  30 g  0   No current facility-administered medications on file prior to visit.    Allergies  Allergen Reactions  . Adhesive [Tape] Rash    Family History  Problem Relation Age of Onset  . Hypertension Father   . Cancer Father     PROSTATE  . Diabetes Paternal Aunt   . Hypertension Paternal Aunt   . Cancer Paternal Aunt     pancreatic  . Diabetes Paternal Grandmother   . Cancer Paternal Grandmother     leukemia    BP 130/70  Pulse 97  Temp(Src) 98.5 F (36.9 C) (Oral)  Ht 5\' 4"  (1.626 m)  Wt 278 lb (126.1 kg)  BMI 47.70 kg/m2  SpO2 95%   Review of Systems denies weight loss, blurry vision, headache, chest pain, sob, n/v, urinary frequency, muscle cramps, memory loss, depression, cold intolerance, rhinorrhea, and easy bruising.  She has excessive diaphoresis.     Objective:   Physical Exam VS: see vs page GEN: no distress HEAD: head: no deformity eyes: no periorbital swelling, no proptosis external nose and ears are normal mouth: no lesion seen NECK: supple, thyroid is not enlarged CHEST WALL: no deformity LUNGS:  Clear to auscultation.   CV: reg rate and rhythm, no murmur.  ABD: abdomen is soft, nontender.  no hepatosplenomegaly.  not distended.  no hernia MUSCULOSKELETAL: muscle bulk and strength are grossly normal.  no obvious joint swelling.  gait is normal and steady.   EXTEMITIES: no deformity.  no ulcer on the feet.  feet are of normal color and temp.  Trace bilat leg edema.  There is bilateral onychomycosis.   PULSES: dorsalis pedis intact bilat.  no carotid bruit. NEURO:  cn 2-12 grossly intact.   readily moves all 4's.  sensation is intact to touch on the feet.     SKIN:  Normal texture and temperature.  No rash or suspicious lesion is visible.   NODES:  None palpable at the neck. PSYCH: alert, well-oriented.  Does not appear anxious nor depressed.  i have reviewed the following outside records: office notes  Labs: a1c=8.7  i reviewed electrocardiogram from 2014.    Assessment & Plan:  DM: moderate exacerbation: she may still be controllable with oral meds. Morbid obesity, new to me: I advised surgery, but ins apparently won't pay. TIA: with this hx, she needs to avoid hypoglycemia: This impairs the ability to achieve glycemic control.  I'll work around this as best I can.   Patient is advised the following: Patient Instructions  good diet and exercise habits significanly improve the control of your diabetes.  please let me know if you wish to be referred to a dietician.  high blood sugar is very risky to your health.  you should see an eye doctor and dentist every year.  You are at higher than average risk for pneumonia and hepatitis-B.  You should be vaccinated against both.   controlling your blood pressure and cholesterol drastically reduces the damage diabetes does to your body.  this also applies to quitting smoking.  please discuss these with your doctor.  check your blood sugar once a day.  vary the time of day when you check, between before the 3 meals, and at bedtime.  also check if you have symptoms of your blood sugar being too high or too low.  please keep a record of the readings and bring it to your next appointment here.  You can write it on any piece of paper.  please call us sooner if your blood sugar goes below 70, or if you have a lot of readings over 200.   i have sent a prescription to your pharmacy, to add "invokana."  Please drink plenty of fluids on this. Our goal is to get the blood sugar down to the low-100's.  Please call next week if it stays higher, so we can add "bromocriptine." Please come back for a follow-up  appointment in 3 months.

## 2014-05-12 NOTE — Patient Instructions (Addendum)
good diet and exercise habits significanly improve the control of your diabetes.  please let me know if you wish to be referred to a dietician.  high blood sugar is very risky to your health.  you should see an eye doctor and dentist every year.  You are at higher than average risk for pneumonia and hepatitis-B.  You should be vaccinated against both.   controlling your blood pressure and cholesterol drastically reduces the damage diabetes does to your body.  this also applies to quitting smoking.  please discuss these with your doctor.  check your blood sugar once a day.  vary the time of day when you check, between before the 3 meals, and at bedtime.  also check if you have symptoms of your blood sugar being too high or too low.  please keep a record of the readings and bring it to your next appointment here.  You can write it on any piece of paper.  please call us sooner if your blood sugar goes below 70, or if you have a lot of readings over 200.   i have sent a prescription to your pharmacy, to add "invokana."  Please drink plenty of fluids on this. Our goal is to get the blood sugar down to the low-100's.  Please call next week if it stays higher, so we can add "bromocriptine." Please come back for a follow-up appointment in 3 months.

## 2014-08-04 ENCOUNTER — Telehealth: Payer: Self-pay

## 2014-08-04 NOTE — Telephone Encounter (Signed)
Diabetic bundle Lvom to call back and schedule.

## 2014-10-09 ENCOUNTER — Encounter: Payer: Self-pay | Admitting: Endocrinology

## 2014-12-13 ENCOUNTER — Encounter (HOSPITAL_COMMUNITY): Payer: Self-pay | Admitting: Neurology

## 2014-12-13 ENCOUNTER — Emergency Department (HOSPITAL_COMMUNITY): Payer: BLUE CROSS/BLUE SHIELD

## 2014-12-13 ENCOUNTER — Observation Stay (HOSPITAL_COMMUNITY)
Admission: EM | Admit: 2014-12-13 | Discharge: 2014-12-14 | Disposition: A | Discharge status: Home / Self Care | Payer: BLUE CROSS/BLUE SHIELD | Attending: Family Medicine | Admitting: Family Medicine

## 2014-12-13 DIAGNOSIS — Z7982 Long term (current) use of aspirin: Secondary | ICD-10-CM | POA: Insufficient documentation

## 2014-12-13 DIAGNOSIS — Z87891 Personal history of nicotine dependence: Secondary | ICD-10-CM | POA: Insufficient documentation

## 2014-12-13 DIAGNOSIS — Z79899 Other long term (current) drug therapy: Secondary | ICD-10-CM | POA: Insufficient documentation

## 2014-12-13 DIAGNOSIS — E119 Type 2 diabetes mellitus without complications: Secondary | ICD-10-CM

## 2014-12-13 DIAGNOSIS — E876 Hypokalemia: Secondary | ICD-10-CM | POA: Diagnosis not present

## 2014-12-13 DIAGNOSIS — G459 Transient cerebral ischemic attack, unspecified: Principal | ICD-10-CM | POA: Diagnosis present

## 2014-12-13 DIAGNOSIS — M179 Osteoarthritis of knee, unspecified: Secondary | ICD-10-CM | POA: Diagnosis not present

## 2014-12-13 DIAGNOSIS — E785 Hyperlipidemia, unspecified: Secondary | ICD-10-CM | POA: Diagnosis present

## 2014-12-13 DIAGNOSIS — I517 Cardiomegaly: Secondary | ICD-10-CM

## 2014-12-13 DIAGNOSIS — E282 Polycystic ovarian syndrome: Secondary | ICD-10-CM | POA: Insufficient documentation

## 2014-12-13 DIAGNOSIS — R4182 Altered mental status, unspecified: Secondary | ICD-10-CM | POA: Insufficient documentation

## 2014-12-13 DIAGNOSIS — I10 Essential (primary) hypertension: Secondary | ICD-10-CM | POA: Diagnosis not present

## 2014-12-13 DIAGNOSIS — J45909 Unspecified asthma, uncomplicated: Secondary | ICD-10-CM | POA: Insufficient documentation

## 2014-12-13 LAB — COMPREHENSIVE METABOLIC PANEL
ALBUMIN: 3.7 g/dL (ref 3.5–5.2)
ALT: 19 U/L (ref 0–35)
AST: 20 U/L (ref 0–37)
Alkaline Phosphatase: 62 U/L (ref 39–117)
Anion gap: 12 (ref 5–15)
BUN: 26 mg/dL — AB (ref 6–23)
CO2: 25 mmol/L (ref 19–32)
Calcium: 9.2 mg/dL (ref 8.4–10.5)
Chloride: 96 mEq/L (ref 96–112)
Creatinine, Ser: 1 mg/dL (ref 0.50–1.10)
GFR calc Af Amer: 74 mL/min — ABNORMAL LOW (ref >90)
GFR, EST NON AFRICAN AMERICAN: 64 mL/min — AB (ref >90)
GLUCOSE: 224 mg/dL — AB (ref 70–99)
Potassium: 3.4 mmol/L — ABNORMAL LOW (ref 3.5–5.1)
Sodium: 133 mmol/L — ABNORMAL LOW (ref 135–145)
TOTAL PROTEIN: 6.6 g/dL (ref 6.0–8.3)
Total Bilirubin: 0.7 mg/dL (ref 0.3–1.2)

## 2014-12-13 LAB — TSH: TSH: 0.557 u[IU]/mL (ref 0.350–4.500)

## 2014-12-13 LAB — GLUCOSE, CAPILLARY
Glucose-Capillary: 174 mg/dL — ABNORMAL HIGH (ref 70–99)
Glucose-Capillary: 156 mg/dL — ABNORMAL HIGH (ref 70–99)

## 2014-12-13 LAB — URINE MICROSCOPIC-ADD ON

## 2014-12-13 LAB — DIFFERENTIAL
BASOS ABS: 0 10*3/uL (ref 0.0–0.1)
Basophils Relative: 0 % (ref 0–1)
EOS PCT: 2 % (ref 0–5)
Eosinophils Absolute: 0.3 10*3/uL (ref 0.0–0.7)
LYMPHS PCT: 27 % (ref 12–46)
Lymphs Abs: 4 10*3/uL (ref 0.7–4.0)
Monocytes Absolute: 1.1 10*3/uL — ABNORMAL HIGH (ref 0.1–1.0)
Monocytes Relative: 7 % (ref 3–12)
Neutro Abs: 9.3 10*3/uL — ABNORMAL HIGH (ref 1.7–7.7)
Neutrophils Relative %: 64 % (ref 43–77)

## 2014-12-13 LAB — I-STAT CHEM 8, ED
BUN: 30 mg/dL — ABNORMAL HIGH (ref 6–23)
CHLORIDE: 96 meq/L (ref 96–112)
Calcium, Ion: 1.11 mmol/L — ABNORMAL LOW (ref 1.12–1.23)
Creatinine, Ser: 0.9 mg/dL (ref 0.50–1.10)
Glucose, Bld: 221 mg/dL — ABNORMAL HIGH (ref 70–99)
HCT: 50 % — ABNORMAL HIGH (ref 36.0–46.0)
Hemoglobin: 17 g/dL — ABNORMAL HIGH (ref 12.0–15.0)
Potassium: 3.3 mmol/L — ABNORMAL LOW (ref 3.5–5.1)
Sodium: 133 mmol/L — ABNORMAL LOW (ref 135–145)
TCO2: 21 mmol/L (ref 0–100)

## 2014-12-13 LAB — RAPID URINE DRUG SCREEN, HOSP PERFORMED
Amphetamines: NOT DETECTED
BARBITURATES: NOT DETECTED
Benzodiazepines: NOT DETECTED
COCAINE: NOT DETECTED
Opiates: NOT DETECTED
Tetrahydrocannabinol: NOT DETECTED

## 2014-12-13 LAB — CBC
HCT: 44.8 % (ref 36.0–46.0)
Hemoglobin: 15.4 g/dL — ABNORMAL HIGH (ref 12.0–15.0)
MCH: 29.3 pg (ref 26.0–34.0)
MCHC: 34.4 g/dL (ref 30.0–36.0)
MCV: 85.3 fL (ref 78.0–100.0)
Platelets: 324 10*3/uL (ref 150–400)
RBC: 5.25 MIL/uL — ABNORMAL HIGH (ref 3.87–5.11)
RDW: 13.9 % (ref 11.5–15.5)
WBC: 14.8 10*3/uL — ABNORMAL HIGH (ref 4.0–10.5)

## 2014-12-13 LAB — CBG MONITORING, ED: Glucose-Capillary: 197 mg/dL — ABNORMAL HIGH (ref 70–99)

## 2014-12-13 LAB — PROTIME-INR
INR: 0.99 (ref 0.00–1.49)
Prothrombin Time: 13.2 seconds (ref 11.6–15.2)

## 2014-12-13 LAB — APTT: aPTT: 26 seconds (ref 24–37)

## 2014-12-13 LAB — ETHANOL: Alcohol, Ethyl (B): <5 mg/dL (ref 0–9)

## 2014-12-13 LAB — URINALYSIS, ROUTINE W REFLEX MICROSCOPIC
BILIRUBIN URINE: NEGATIVE
Glucose, UA: >1000 mg/dL — AB
Ketones, ur: NEGATIVE mg/dL
Nitrite: NEGATIVE
PROTEIN: NEGATIVE mg/dL
Specific Gravity, Urine: 1.025 (ref 1.005–1.030)
Urobilinogen, UA: 0.2 mg/dL (ref 0.0–1.0)
pH: 5 (ref 5.0–8.0)

## 2014-12-13 LAB — I-STAT TROPONIN, ED: Troponin i, poc: 0 ng/mL (ref 0.00–0.08)

## 2014-12-13 MED ORDER — POTASSIUM CHLORIDE CRYS ER 20 MEQ PO TBCR
40.0000 meq | EXTENDED_RELEASE_TABLET | Freq: Once | ORAL | Status: AC
  Administered 2014-12-13: 40 meq via ORAL
  Filled 2014-12-13: qty 2

## 2014-12-13 MED ORDER — DICLOFENAC SODIUM 75 MG PO TBEC
75.0000 mg | DELAYED_RELEASE_TABLET | Freq: Two times a day (BID) | ORAL | Status: DC
  Administered 2014-12-13: 75 mg via ORAL
  Filled 2014-12-13 (×3): qty 1

## 2014-12-13 MED ORDER — ASPIRIN 81 MG PO TBEC: 81.0000 mg | DELAYED_RELEASE_TABLET | Freq: Every day | ORAL | Status: DC

## 2014-12-13 MED ORDER — HEPARIN SODIUM (PORCINE) 5000 UNIT/ML IJ SOLN
5000.0000 [IU] | Freq: Three times a day (TID) | INTRAMUSCULAR | Status: DC
  Administered 2014-12-13 – 2014-12-14 (×3): 5000 [IU] via SUBCUTANEOUS
  Filled 2014-12-13 (×3): qty 1

## 2014-12-13 MED ORDER — SODIUM CHLORIDE 0.9 % IV SOLN: INTRAVENOUS | Status: DC

## 2014-12-13 MED ORDER — DEXTROSE 5 % IV SOLN: 1.0000 g | Freq: Once | INTRAVENOUS | Status: DC

## 2014-12-13 MED ORDER — ACETAMINOPHEN 325 MG PO TABS: 650.0000 mg | ORAL_TABLET | ORAL | Status: DC | PRN

## 2014-12-13 MED ORDER — HYDROCHLOROTHIAZIDE 25 MG PO TABS
25.0000 mg | ORAL_TABLET | Freq: Every day | ORAL | Status: DC
  Administered 2014-12-14: 25 mg via ORAL
  Filled 2014-12-13: qty 1

## 2014-12-13 MED ORDER — CITALOPRAM HYDROBROMIDE 10 MG PO TABS
20.0000 mg | ORAL_TABLET | Freq: Every day | ORAL | Status: DC
  Administered 2014-12-14: 20 mg via ORAL
  Filled 2014-12-13 (×2): qty 2

## 2014-12-13 MED ORDER — ATORVASTATIN CALCIUM 10 MG PO TABS
20.0000 mg | ORAL_TABLET | Freq: Every evening | ORAL | Status: DC
  Administered 2014-12-13: 20 mg via ORAL
  Filled 2014-12-13: qty 2

## 2014-12-13 MED ORDER — INSULIN ASPART 100 UNIT/ML ~~LOC~~ SOLN
0.0000 [IU] | Freq: Three times a day (TID) | SUBCUTANEOUS | Status: DC
  Administered 2014-12-13 – 2014-12-14 (×3): 2 [IU] via SUBCUTANEOUS

## 2014-12-13 MED ORDER — ALBUTEROL SULFATE (2.5 MG/3ML) 0.083% IN NEBU: 2.5000 mg | INHALATION_SOLUTION | Freq: Four times a day (QID) | RESPIRATORY_TRACT | Status: DC | PRN

## 2014-12-13 MED ORDER — ALBUTEROL SULFATE HFA 108 (90 BASE) MCG/ACT IN AERS: 1.0000 | INHALATION_SPRAY | RESPIRATORY_TRACT | Status: DC | PRN

## 2014-12-13 MED ORDER — LISINOPRIL-HYDROCHLOROTHIAZIDE 20-25 MG PO TABS: 1.0000 | ORAL_TABLET | Freq: Every day | ORAL | Status: DC

## 2014-12-13 MED ORDER — LISINOPRIL 20 MG PO TABS
20.0000 mg | ORAL_TABLET | Freq: Every day | ORAL | Status: DC
  Administered 2014-12-14: 20 mg via ORAL
  Filled 2014-12-13: qty 1

## 2014-12-13 MED ORDER — ASPIRIN EC 81 MG PO TBEC
81.0000 mg | DELAYED_RELEASE_TABLET | Freq: Every day | ORAL | Status: DC
  Administered 2014-12-14: 81 mg via ORAL
  Filled 2014-12-13: qty 1

## 2014-12-13 MED ORDER — STROKE: EARLY STAGES OF RECOVERY BOOK
Freq: Once | Status: AC
Start: 2014-12-13 — End: 2014-12-13
  Administered 2014-12-13: 17:00:00
  Filled 2014-12-13: qty 1

## 2014-12-13 NOTE — Progress Notes (Signed)
  Echocardiogram 2D Echocardiogram has been performed.  Diamond Nickel 12/13/2014, 3:25 PM

## 2014-12-13 NOTE — ED Provider Notes (Signed)
CSN: [id number]     Arrival date & time 12/13/14  1021 History   First MD Initiated Contact with Patient 12/13/14 1025     Chief Complaint  Patient presents with  . 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 Patient states she woke up feeling unwell and thought it might be from her sugar which was 300. She's been on prednisone recently for a respiratory infection. She denies any fevers, chills, nausea vomiting. No chest pain or shortness of breath. No abdominal pain. Yesterday she had some difficulty speaking and trouble getting her words out which has resolved. The friend at bedside says her speech is still slow but she appears back to normal. She denies any dizziness or vertigo currently no focal weakness, numbness or tingling.  The history is provided by the patient and the EMS personnel.    Past Medical History  Diagnosis Date  . Diabetes mellitus   . PCOS (polycystic ovarian syndrome)   . Hypertension   . Osteoarthritis    Past Surgical History  Procedure Laterality Date  . Cesarean section  2001   Family History  Problem Relation Age of Onset  . Hypertension Father   . Cancer Father     PROSTATE  . Diabetes Paternal Aunt   . Hypertension Paternal Aunt   . Cancer Paternal Aunt     pancreatic  . Diabetes Paternal Grandmother   . Cancer Paternal Grandmother     leukemia   History  Substance Use Topics  . Smoking status: Former Smoker    Quit date: 05/11/2012  . Smokeless tobacco: Never Used  . Alcohol Use: Yes   OB History    Gravida Para Term Preterm AB TAB SAB Ectopic Multiple Living   1 1  1      1      Review of Systems  Constitutional: Negative for fever, activity change and appetite change.  Respiratory: Negative for  cough, chest tightness and shortness of breath.   Cardiovascular: Negative for chest pain.  Gastrointestinal: Negative for nausea, vomiting and abdominal pain.  Genitourinary: Negative for dysuria, hematuria, vaginal bleeding and vaginal discharge.  Musculoskeletal: Negative for myalgias and arthralgias.  Skin: Negative for rash.  Neurological: Positive for dizziness, speech difficulty and light-headedness. Negative for weakness.  A complete 10 system review of systems was obtained and all systems are negative except as noted in the HPI and PMH.      Allergies  Adhesive  Home Medications   Prior to Admission medications   Medication Sig Start Date End Date Taking? Authorizing Provider  albuterol (PROVENTIL HFA;VENTOLIN HFA) 108 (90 BASE) MCG/ACT inhaler Inhale 2 puffs into the lungs every 6 (six) hours as needed for wheezing or shortness of breath.   Yes Historical Provider, MD  aspirin 81 MG EC tablet Take 1 tablet (81 mg total) by mouth daily. 02/17/13  Yes Leone Haven, MD  atorvastatin (LIPITOR) 20 MG tablet Take 20 mg by mouth every evening.   Yes Historical Provider, MD  azithromycin (ZITHROMAX) 500 MG tablet Take 500 mg by mouth daily. On a seven day course finishes 12/14/13   Yes Historical Provider, MD  Canagliflozin (INVOKANA) 300 MG TABS Take 1 tablet (300 mg total) by mouth daily. 05/12/14  Yes Renato Shin, MD  citalopram (CELEXA) 20 MG tablet Take 60 mg by mouth daily.  Yes Historical Provider, MD  diclofenac (VOLTAREN) 75 MG EC tablet Take 75 mg by mouth 2 (two) times daily.   Yes Historical Provider, MD  lisinopril-hydrochlorothiazide (PRINZIDE,ZESTORETIC) 20-25 MG per tablet Take 1 tablet by mouth daily.   Yes Historical Provider, MD  metFORMIN (GLUCOPHAGE) 1000 MG tablet Take 1,000 mg by mouth 2 (two) times daily with a meal.   Yes Historical Provider, MD  predniSONE (DELTASONE) 20 MG tablet Take 10 mg by mouth daily with breakfast. Was on a taper, finishes 1/7.   Yes  Historical Provider, MD  sitaGLIPtin (JANUVIA) 100 MG tablet Take 100 mg by mouth daily.   Yes Historical Provider, MD  levonorgestrel (MIRENA) 20 MCG/24HR IUD 1 each by Intrauterine route once.    Historical Provider, MD   BP 125/60 mmHg  Pulse 78  Temp(Src) 98.2 F (36.8 C) (Oral)  Resp 20  Ht 5\' 4"  (1.626 m)  Wt 260 lb 11.2 oz (118.253 kg)  BMI 44.73 kg/m2  SpO2 93% Physical Exam  Constitutional: She is oriented to person, place, and time. She appears well-developed and well-nourished. No distress.  Mildly slow speech, no dysarthria.  HENT:  Head: Normocephalic and atraumatic.  Mouth/Throat: Oropharynx is clear and moist. No oropharyngeal exudate.  Eyes: Conjunctivae and EOM are normal. Pupils are equal, round, and reactive to light.  Neck: Normal range of motion. Neck supple.  No meningismus.  Cardiovascular: Normal rate, regular rhythm, normal heart sounds and intact distal pulses.   No murmur heard. Pulmonary/Chest: Effort normal and breath sounds normal. No respiratory distress.  Abdominal: Soft. There is no tenderness. There is no rebound and no guarding.  Musculoskeletal: Normal range of motion. She exhibits no edema or tenderness.  Neurological: She is alert and oriented to person, place, and time. No cranial nerve deficit. She exhibits normal muscle tone. Coordination normal.  No ataxia on finger to nose bilaterally. No pronator drift. 5/5 strength throughout. CN 2-12 intact. Negative Romberg. Equal grip strength. Sensation intact. Gait is normal.   Skin: Skin is warm.  Psychiatric: She has a normal mood and affect. Her behavior is normal.  Nursing note and vitals reviewed.   ED Course  Procedures (including critical care time) Labs Review Labs Reviewed  CBC - Abnormal; Notable for the following:    WBC 14.8 (*)    RBC 5.25 (*)    Hemoglobin 15.4 (*)    All other components within normal limits  DIFFERENTIAL - Abnormal; Notable for the following:    Neutro Abs  9.3 (*)    Monocytes Absolute 1.1 (*)    All other components within normal limits  COMPREHENSIVE METABOLIC PANEL - Abnormal; Notable for the following:    Sodium 133 (*)    Potassium 3.4 (*)    Glucose, Bld 224 (*)    BUN 26 (*)    GFR calc non Af Amer 64 (*)    GFR calc Af Amer 74 (*)    All other components within normal limits  URINALYSIS, ROUTINE W REFLEX MICROSCOPIC - Abnormal; Notable for the following:    APPearance CLOUDY (*)    Glucose, UA >1000 (*)    Hgb urine dipstick MODERATE (*)    Leukocytes, UA LARGE (*)    All other components within normal limits  URINE MICROSCOPIC-ADD ON - Abnormal; Notable for the following:    Squamous Epithelial / LPF MANY (*)    Bacteria, UA MANY (*)    All other components within normal limits  GLUCOSE, CAPILLARY -  Abnormal; Notable for the following:    Glucose-Capillary 174 (*)    All other components within normal limits  CBG MONITORING, ED - Abnormal; Notable for the following:    Glucose-Capillary 197 (*)    All other components within normal limits  I-STAT CHEM 8, ED - Abnormal; Notable for the following:    Sodium 133 (*)    Potassium 3.3 (*)    BUN 30 (*)    Glucose, Bld 221 (*)    Calcium, Ion 1.11 (*)    Hemoglobin 17.0 (*)    HCT 50.0 (*)    All other components within normal limits  URINE CULTURE  PROTIME-INR  APTT  ETHANOL  URINE RAPID DRUG SCREEN (HOSP PERFORMED)  TSH  HEMOGLOBIN A1C  LIPID PANEL  BASIC METABOLIC PANEL  CBC  I-STAT TROPOININ, ED  I-STAT TROPOININ, ED  I-STAT TROPOININ, ED    Imaging Review Ct Head (brain) Wo Contrast  12/13/2014   CLINICAL DATA:  Altered mental status.  Weakness.  Hyperglycemia.  EXAM: CT HEAD WITHOUT CONTRAST  TECHNIQUE: Contiguous axial images were obtained from the base of the skull through the vertex without intravenous contrast.  COMPARISON:  02/10/2013  FINDINGS: No evidence of intracranial hemorrhage, brain edema, or other signs of acute infarction. No evidence of  intracranial mass lesion or mass effect.  No abnormal extraaxial fluid collections identified. Ventricles are normal in size. No skull abnormality identified.  IMPRESSION: Negative noncontrast head CT.   Electronically Signed   By: Earle Gell M.D.   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 Slow speech now but almost back to baseline. Code stroke not activated.   CT head is negative.  Urinalysis appears contaminated, we'll sent culture.   Patient was similar presentation 2 years ago and negative TIA workup. She has been on baby aspirin since.   Back to baseline in ED now per husband.  Speech normal.  TIA possible or seizure.  D/w [facility] residents for unassigned admission.  Ezequiel Essex, MD 12/13/14 [id number]

## 2014-12-13 NOTE — Progress Notes (Signed)
Received report from Judson Roch, ED RN. Pt coming to 4N25. All questions answered.

## 2014-12-13 NOTE — ED Notes (Signed)
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 Coworkers called EMS and she began crying and developed h/a. Has hx of TIA. Pt is alert and oriented. CBG 272, BP 132/78, HR 70 SR.

## 2014-12-13 NOTE — H&P (Signed)
[facility] Admission History and Physical Service Pager: [phone]  Patient name: Lydia [name] Medical record number: [id number] Date of birth: 12-01-1961 Age: 54 y.o. Gender: female  Primary Care Provider: No primary care provider on file. Consultants: none Code Status: Full  Chief Complaint: "slowed speech"  Assessment and Plan: Lydia [name] is a 54 y.o. female presenting with 1 day history of slow speech concerning for TIA . PMH is significant for hx of TIA 2014, PCOS, HTN, HLD, T2DM, OA of knee, asthma.  # TIA: hx of expressive aphasia symptoms from TIA in 2014. MRI/MRA normal at that time. Symptoms resolved while in ED, though pt still feels "worn out". Vitals stable. ED workup including CMP with K+ 3.4, elevated glucose 224, CBC with WBC 14.8, Hgb 15.4, UA showing glucose, hemoglobin, leukocytes. EKG sinus rhythm. CT head normal. Ddx including TIA, hyperglycemia episode, arrhythmia, menopausal. - neuro checks q4hrs - telemetry - risk strat labs: A1c, fasting lipid, TSH - 2d echo, carotid duplex - tylenol for headache - bmet and cbc in AM  # T2DM: - hold home meds (metformin, canagliflozin, sitagliptin) - a1c as above - SSI - CBG monitoring qac qhs  # Hypokalemia: mild, 3.4. - 40 meq kdur once - bmet in AM  # HTN: normotensive 110-120s/60-70s - continue home lisinopril 20mg , hctz 25mg   # HLD: - lipid panel as above - continue home atorva 20mg   # Asthma: being treated for recent exacerbation/illness - hold prednisone, azithromycin - albuterol inhaler q4hrs prn  FEN/GI: carb mod diet, NS @ 75cc/hr Prophylaxis: heparin sq  Disposition: admit to tele  History of Present Illness: Lydia [name] is a 54 y.o. female presenting with not feeling well, fatigue, slowed/difficulty speech this morning. Patient has history of reported TIA in 2014 with expressive aphasia that resolved. 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 While at work during attendance call she had rather sudden onset (over 5 minutes) of feeling weak and "worn out", and slowed speech. She says a co-worker was a Electrical engineer and definitely noted her speech to be not normal. She immediately checked her blood sugar (307), but continued to have slow speech so was brought to the ED. She additionally complains of a "limp body", and has had multiple hotflashes (also notes these have been more frequent over the past week). She denies any weakness, numbness, history of seizures. She denies any chest pains/heart palps/racing heart, shortness of breath, does endorse a dull headache that has developed while in the ED, and also has an episode of dizziness when leaning over after going to the bathroom while in the ED. She says she has recent medication changes due to "asthma bronchitis" for the past 3 weeks for which she was given 8 days of prednisone, z-pack and tessalon, states has 1 more day of this. She has been using her albuterol every 4 hours for the past week as well. She denies any changes in weight, memory, vision, urinary pattern (says she is always voiding), no fevers or chills (as noted above having hot flashes). She also mentions that at a meeting yesterday she had "difficulty expressing" herself, requiring her to re-phrase what she was saying and use different words. She says her last period was about 3.5 years ago, but that she doesn't have regular periods because she has an IUD that was put in for fibroids.   Review  Of Systems: Per HPI with the following additions: none Otherwise 12 point review of systems was performed and was unremarkable.  Patient Active Problem List   Diagnosis Date Noted  . TIA (transient ischemic attack) 02/10/2013  . Hyperlipidemia 02/10/2013  . Osteoarthritis 02/10/2013  . History of PCOS 10/29/2012  . HTN (hypertension), benign 10/11/2012  . Diabetes  mellitus, type II 10/11/2012   Past Medical History: Past Medical History  Diagnosis Date  . Diabetes mellitus   . PCOS (polycystic ovarian syndrome)   . Hypertension   . Osteoarthritis    Past Surgical History: Past Surgical History  Procedure Laterality Date  . Cesarean section  2001   Social History: History  Substance Use Topics  . Smoking status: Former Smoker    Quit date: 05/11/2012  . Smokeless tobacco: Never Used  . Alcohol Use: Yes   Additional social history: former smoker, very occasional social alcohol use, lives with husband  Please also refer to relevant sections of EMR.  Family History: Family History  Problem Relation Age of Onset  . Hypertension Father   . Cancer Father     PROSTATE  . Diabetes Paternal Aunt   . Hypertension Paternal Aunt   . Cancer Paternal Aunt     pancreatic  . Diabetes Paternal Grandmother   . Cancer Paternal Grandmother     leukemia   Allergies and Medications: Allergies  Allergen Reactions  . Adhesive [Tape] Rash   No current facility-administered medications on file prior to encounter.   Current Outpatient Prescriptions on File Prior to Encounter  Medication Sig Dispense Refill  . aspirin 81 MG EC tablet Take 1 tablet (81 mg total) by mouth daily. 90 tablet 3  . atorvastatin (LIPITOR) 20 MG tablet Take 20 mg by mouth every evening.    . Canagliflozin (INVOKANA) 300 MG TABS Take 1 tablet (300 mg total) by mouth daily. 30 tablet 11  . citalopram (CELEXA) 20 MG tablet Take 20 mg by mouth daily.      . diclofenac (VOLTAREN) 75 MG EC tablet Take 75 mg by mouth 2 (two) times daily.    Marland Kitchen levonorgestrel (MIRENA) 20 MCG/24HR IUD 1 each by Intrauterine route once.    Marland Kitchen lisinopril-hydrochlorothiazide (PRINZIDE,ZESTORETIC) 20-25 MG per tablet Take 1 tablet by mouth daily.    . metFORMIN (GLUCOPHAGE) 500 MG tablet Take 1 tablet (500 mg total) by mouth 2 (two) times daily with a meal. 60 tablet 1  . sitaGLIPtin (JANUVIA) 100 MG  tablet Take 100 mg by mouth daily.    Marland Kitchen triamcinolone ointment (KENALOG) 0.5 % Apply topically 2 (two) times daily. 30 g 0    Objective: BP 125/60 mmHg  Pulse 78  Temp(Src) 98.2 F (36.8 C) (Oral)  Resp 20  SpO2 93% Exam: General: NAD, sitting in bed, converses appropriately HEENT: PERRL, EOMI, MMM.  Cardiovascular: RRR, normal s1s2, no murmur. 2+ radial, PT, DP pulses bilaterally Respiratory: clear bilaterally, normal WOB, no wheezes noted Abdomen: soft, obese, nontender, nondistended, normal bowel sounds Extremities: no edema or cyanosis, wwp. Skin: no rashes Neuro: alert and oriented x 3. CN2-12 normal. Strength 5/5 biceps, grip, ankle extension bilaterally. Finger to nose testing normal, no dysdiadokinesia, heel to shin normal. No pronator drift. Downgoing babinksi bilaterally.   Labs and Imaging: CBC BMET   Recent Labs Lab 12/13/14 1030 12/13/14 1036  WBC 14.8*  --   HGB 15.4* 17.0*  HCT 44.8 50.0*  PLT 324  --     Recent Labs Lab 12/13/14 1030  12/13/14 1036  NA 133* 133*  K 3.4* 3.3*  CL 96 96  CO2 25  --   BUN 26* 30*  CREATININE 1.00 0.90  GLUCOSE 224* 221*  CALCIUM 9.2  --      UDS negative Ethanol negative UA >1000 glucose, moderate hgb, large leukocytes  Ct Head (brain) Wo Contrast  12/13/2014   CLINICAL DATA:  Altered mental status.  Weakness.  Hyperglycemia.  EXAM: CT HEAD WITHOUT CONTRAST  TECHNIQUE: Contiguous axial images were obtained from the base of the skull through the vertex without intravenous contrast.  COMPARISON:  02/10/2013  FINDINGS: No evidence of intracranial hemorrhage, brain edema, or other signs of acute infarction. No evidence of intracranial mass lesion or mass effect.  No abnormal extraaxial fluid collections identified. Ventricles are normal in size. No skull abnormality identified.  IMPRESSION: Negative noncontrast head CT.   Electronically Signed   By: Earle Gell M.D.   On: 12/13/2014 11:58     Leone Brand,  MD 12/13/2014, 1:36 PM PGY-2, [facility] pager: [phone], text pages welcome

## 2014-12-14 DIAGNOSIS — E785 Hyperlipidemia, unspecified: Secondary | ICD-10-CM

## 2014-12-14 DIAGNOSIS — I10 Essential (primary) hypertension: Secondary | ICD-10-CM

## 2014-12-14 DIAGNOSIS — G459 Transient cerebral ischemic attack, unspecified: Secondary | ICD-10-CM

## 2014-12-14 DIAGNOSIS — E119 Type 2 diabetes mellitus without complications: Secondary | ICD-10-CM

## 2014-12-14 LAB — CBC
HCT: 42.4 % (ref 36.0–46.0)
HEMOGLOBIN: 14.4 g/dL (ref 12.0–15.0)
MCH: 29.1 pg (ref 26.0–34.0)
MCHC: 34 g/dL (ref 30.0–36.0)
MCV: 85.8 fL (ref 78.0–100.0)
PLATELETS: 303 10*3/uL (ref 150–400)
RBC: 4.94 MIL/uL (ref 3.87–5.11)
RDW: 14 % (ref 11.5–15.5)
WBC: 15.4 10*3/uL — ABNORMAL HIGH (ref 4.0–10.5)

## 2014-12-14 LAB — LIPID PANEL
CHOL/HDL RATIO: 6.6 ratio
Cholesterol: 179 mg/dL (ref 0–200)
HDL: 27 mg/dL — AB (ref >39)
LDL Cholesterol: UNDETERMINED mg/dL (ref 0–99)
Triglycerides: 453 mg/dL — ABNORMAL HIGH (ref <150)
VLDL: UNDETERMINED mg/dL (ref 0–40)

## 2014-12-14 LAB — HEMOGLOBIN A1C
HEMOGLOBIN A1C: 7.9 % — AB (ref <5.7)
Mean Plasma Glucose: 180 mg/dL — ABNORMAL HIGH (ref <117)

## 2014-12-14 LAB — BASIC METABOLIC PANEL
Anion gap: 6 (ref 5–15)
BUN: 23 mg/dL (ref 6–23)
CHLORIDE: 100 meq/L (ref 96–112)
CO2: 28 mmol/L (ref 19–32)
Calcium: 8.8 mg/dL (ref 8.4–10.5)
Creatinine, Ser: 0.9 mg/dL (ref 0.50–1.10)
GFR calc Af Amer: 84 mL/min — ABNORMAL LOW (ref >90)
GFR calc non Af Amer: 73 mL/min — ABNORMAL LOW (ref >90)
GLUCOSE: 160 mg/dL — AB (ref 70–99)
Potassium: 3.6 mmol/L (ref 3.5–5.1)
SODIUM: 134 mmol/L — AB (ref 135–145)

## 2014-12-14 LAB — GLUCOSE, CAPILLARY
GLUCOSE-CAPILLARY: 156 mg/dL — AB (ref 70–99)
Glucose-Capillary: 169 mg/dL — ABNORMAL HIGH (ref 70–99)

## 2014-12-14 MED ORDER — TRAMADOL HCL 50 MG PO TABS: 50.0000 mg | ORAL_TABLET | Freq: Two times a day (BID) | ORAL | Status: DC | PRN

## 2014-12-14 NOTE — Progress Notes (Signed)
Discharge orders received. Pt for discharge home today. IV d/c'd. Pt given discharge instructions with verbalized understanding. Family in room to assist with discharge. Staff brought pt downstairs via wheelchair.   

## 2014-12-14 NOTE — Discharge Summary (Signed)
[facility] Discharge Summary  Patient name: Lydia [name] Medical record number: [id number] Date of birth: 04-Nov-1961 Age: 54 y.o. Gender: female Date of Admission: 12/13/2014  Date of Discharge: 12/14/2014 Admitting Physician: Lind Covert, MD  Primary Care Provider:  Dr. Rosalyn Gess in [facility] Consultants: none  Indication for Hospitalization: slowed speech, ?TIA  Discharge Diagnoses/Problem List:  Possible TIA T2DM Hypokalemia - resolved HTN HLD Hypertriglyceridemia Asthma Knee OA Klebsiella UTI  Disposition: home  Discharge Condition: stable  Discharge Exam: See progress note from day of discharge  Brief Hospital Course:  Lydia [name] is a 54 y.o. female presenting with 1 day history of slow speech concerning for TIA . PMH is significant for hx of TIA 2014, PCOS, HTN, HLD, T2DM, OA of knee, asthma.  # Possible TIA: Presenting reporting fogging of mentation and slow speech.  Pt has h/o expressive aphasia symptoms from TIA in 2014. MRI/MRA normal at that time. Symptoms resolved while in ED. CT head normal, blood glucose normal.  Possible that symptoms could have been related to TIA, but also possible that this was related to hypoperfusion related to BP vs arrhythmia vs seizure.  Patient had normal neuro exam throughout admission.  No arrhythmias noted on telemetry monitoring and no seizure-like activity.  Risk stratification labs showed: A1c 7.9, lipid panel with ASCVD 43yr risk of 5.3%, and TSH 0.557.  Echo on 1/6 showed EF 96-78%, grade 2 diastolic dysfunction, mild concentric hypertrophy. Carotid dopplers (1/7) showed 1-39% ICA stenosis bilaterally.  No signs or symptoms of focal CNS or CV disease.  # T2DM: A1c 7.9 on admission, improved from previous A1c of 8.4 in 2014. Patient managed to sensitive sliding scale insulin throughout admission. Home medications of metformin, canagliflozin, sitagliptin resumed at discharge.  # Hypokalemia:  Potassium 3.3 on admission. Resolved S/p 40 meq kdur.  # HLD, Hypertriglyceridemia: Lipid panel on admission showed ASCVD 49yr risk 5.3% and Triglycerides of 453.  Home atorvastatin 20 mg daily was continued throughout admission and on discharge. Nutrition was counseled during admission for dietary counseling for hypertriglyceridemia.  Klebsiella UTI: Urinalysis in the ED showed many bacteria and large leukocytes but also many squamous epithelials. Patient denied any symptoms of UTI so she was not empirically treated. After discharge, urine culture resulted with Klebsiella UTI. Prescription for Keflex sent to pharmacy and patient notified.  All other chronic medical conditions stable throughout admission and managed with home medication regimens.  Issues for Follow Up:  - f/u on continued improved neurologic function - recommend OP Neurology f/u - f/u T2DM control - patient reports much improved A1c - f/u hypertriglyceridemia - recommend diet modifications, can consider fibrates in the future - f/u asthma control - patient reports multiple exacerbations requiring prednisone annually. Consider addition of controller. - f/u pain control - consider transition off of long-term NSAIDs given interaction with aspirin, risks of bleeding, CV risk. Can consider tramadol. Patient was counselled about risks of NSAIDs. - Consider repeat CBC - leukocytosis throughout admission, likely related to prednisone use  Significant Procedures: none  Significant Labs and Imaging:   Recent Labs Lab 12/13/14 1030 12/13/14 1036 12/14/14 0532  WBC 14.8*  --  15.4*  HGB 15.4* 17.0* 14.4  HCT 44.8 50.0* 42.4  PLT 324  --  303    Recent Labs Lab 12/13/14 1030 12/13/14 1036 12/14/14 0532  NA 133* 133* 134*  K 3.4* 3.3* 3.6  CL 96 96 100  CO2 25  --  28  GLUCOSE  224* 221* 160*  BUN 26* 30* 23  CREATININE 1.00 0.90 0.90  CALCIUM 9.2  --  8.8  ALKPHOS 62  --   --   AST 20  --   --   ALT 19  --   --    ALBUMIN 3.7  --   --     UDS negative Ethanol negative UA >1000 glucose, moderate hgb, large leukocytes  A1c 7.9 TSH 0.557 Lipid Panel   Labs (Brief)       Component Value Date/Time   CHOL 179 12/14/2014 0550   TRIG 453* 12/14/2014 0550   HDL 27* 12/14/2014 0550   CHOLHDL 6.6 12/14/2014 0550   VLDL UNABLE TO CALCULATE IF TRIGLYCERIDE OVER 400 mg/dL 12/14/2014 0550   LDLCALC UNABLE TO CALCULATE IF TRIGLYCERIDE OVER 400 mg/dL 12/14/2014 0550      Urinalysis    Component Value Date/Time   COLORURINE YELLOW 12/13/2014 1102   APPEARANCEUR CLOUDY* 12/13/2014 1102   LABSPEC 1.025 12/13/2014 1102   PHURINE 5.0 12/13/2014 1102   GLUCOSEU >1000* 12/13/2014 1102   HGBUR MODERATE* 12/13/2014 1102   BILIRUBINUR NEGATIVE 12/13/2014 1102   KETONESUR NEGATIVE 12/13/2014 1102   PROTEINUR NEGATIVE 12/13/2014 1102   UROBILINOGEN 0.2 12/13/2014 1102   NITRITE NEGATIVE 12/13/2014 1102   LEUKOCYTESUR LARGE* 12/13/2014 1102     UCx : >100,000 Klebsiella - resistant only to ampicillin and intermediate resistance to Macrobid.  Imaging/Diagnostic Tests: Ct Head (brain) Wo Contrast (1/6):  Negative noncontrast head CT.   Results/Tests Pending at Time of Discharge:   Discharge Medications:    Medication List    TAKE these medications        albuterol 108 (90 BASE) MCG/ACT inhaler  Commonly known as:  PROVENTIL HFA;VENTOLIN HFA  Inhale 2 puffs into the lungs every 6 (six) hours as needed for wheezing or shortness of breath.     aspirin 81 MG EC tablet  Take 1 tablet (81 mg total) by mouth daily.     atorvastatin 20 MG tablet  Commonly known as:  LIPITOR  Take 20 mg by mouth every evening.     azithromycin 500 MG tablet  Commonly known as:  ZITHROMAX  Take 500 mg by mouth daily. On a seven day course finishes 12/14/13     canagliflozin 300 MG Tabs tablet  Commonly known as:  INVOKANA  Take 1 tablet (300 mg total) by mouth daily.     citalopram  20 MG tablet  Commonly known as:  CELEXA  Take 60 mg by mouth daily.     diclofenac 75 MG EC tablet  Commonly known as:  VOLTAREN  Take 75 mg by mouth 2 (two) times daily.     levonorgestrel 20 MCG/24HR IUD  Commonly known as:  MIRENA  1 each by Intrauterine route once.     lisinopril-hydrochlorothiazide 20-25 MG per tablet  Commonly known as:  PRINZIDE,ZESTORETIC  Take 1 tablet by mouth daily.     metFORMIN 1000 MG tablet  Commonly known as:  GLUCOPHAGE  Take 1,000 mg by mouth 2 (two) times daily with a meal.     predniSONE 20 MG tablet  Commonly known as:  DELTASONE  Take 10 mg by mouth daily with breakfast. Was on a taper, finishes 1/7.     sitaGLIPtin 100 MG tablet  Commonly known as:  JANUVIA  Take 100 mg by mouth daily.        Discharge Instructions: Please refer to Patient Instructions section of EMR for full  details.  Patient was counseled important signs and symptoms that should prompt return to medical care, changes in medications, dietary instructions, activity restrictions, and follow up appointments.   Follow-Up Appointments: Follow-up Information    Follow up with Dr. Rosalyn Gess (your PCP). Schedule an appointment as soon as possible for a visit in 1 week.   Why:  For hospital follow-up      Follow up with Lenor Coffin, MD.   Specialty:  Neurology   Why:  For hospital follow-up   Contact information:   [address] [place] [address] [phone]       Lavon Paganini, MD 12/15/2014, 9:39 PM PGY-1, [place]

## 2014-12-14 NOTE — Progress Notes (Signed)
Family Medicine Teaching Service Daily Progress Note Intern Pager: [phone]  Patient name: Lydia [name] Medical record number: [id number] Date of birth: Jul 20, 1961 Age: 54 y.o. Gender: female  Primary Care Provider: No primary care provider on file. Consultants: none Code Status: full  Pt Overview and Major Events to Date:  1/6 - admit for "slow speech" and h/o TIA  Assessment and Plan:  Lydia [name] is a 54 y.o. female presenting with 1 day history of slow speech concerning for TIA . PMH is significant for hx of TIA 2014, PCOS, HTN, HLD, T2DM, OA of knee, asthma.  # Possible TIA: h/o expressive aphasia symptoms from TIA in 2014. MRI/MRA normal at that time. Symptoms resolved while in ED, though pt still feels well this AM. Vitals stable. Glucose elevated, though presents like hypoglycemia. CT head normal. Ddx including TIA, hypoperfusion, arrhythmia, seizure. - risk strat labs: A1c 7.9, lipid panel with ASCVD 57yr risk of 5.3%, TSH 0.557 - Echo (1/6) - EF 14-78%, grade 2 diastolic dysfunction, mild concentric hypertrophy - Carotid duplex pending - tylenol for headache  # T2DM: A1c 7.9. CBGs 156-197 O/N. - Will resume home meds at d/c(metformin, canagliflozin, sitagliptin) - Continue Sensitive SSI  # Hypokalemia: Resolved. S/p 40 meq kdur  # HTN: normotensive O/N - continue home lisinopril 20mg , hctz 25mg   # HLD, Hypertriglyceridemia: ASCVD 37yr risk 5.3%, Triglycerides 453. - continue home atorva 20mg  - Consult nutrition for triglyceride counseling   # Asthma: being treated for recent exacerbation/illness - hold prednisone, azithromycin - albuterol inhaler q4hrs prn  # Knee OA: Taking Voltaren PO xmany years. - Counseled about risks of NSAIDs - Will trial Tramadol for pain control - Planning for knee replacement in March  FEN/GI: carb mod diet, SLIV Prophylaxis: heparin sq  Disposition: Pending completion of testing - incl carotid dopplers  Subjective:  Feeling  well this morning. Fog has cleared, at baseline per husband.  Wants to go home.  Objective: Temp:  [98 F (36.7 C)-98.4 F (36.9 C)] 98.3 F (36.8 C) (01/07 0517) Pulse Rate:  [58-80] 58 (01/07 0517) Resp:  [15-20] 19 (01/07 0517) BP: (111-132)/(60-72) 111/65 mmHg (01/07 0517) SpO2:  [93 %-96 %] 95 % (01/07 0517) Weight:  [260 lb 11.2 oz (118.253 kg)] 260 lb 11.2 oz (118.253 kg) (01/06 1332) Physical Exam: General: NAD, sitting in bed, converses well HEENT: PERRL, EOMI, MMM.  Cardiovascular: RRR, normal s1s2, no murmur. 2+ DP pulses bilaterally Respiratory: Normal WOB, rare end expiratory wheezes noted Abdomen: soft, obese, nontender, nondistended, normal bowel sounds Extremities: no edema or cyanosis, wwp. Skin: no rashes Neuro: alert and oriented x 3. CN2-12 normal. Strength 5/5. FNF intact.  Laboratory:  Recent Labs Lab 12/13/14 1030 12/13/14 1036 12/14/14 0532  WBC 14.8*  --  15.4*  HGB 15.4* 17.0* 14.4  HCT 44.8 50.0* 42.4  PLT 324  --  303    Recent Labs Lab 12/13/14 1030 12/13/14 1036 12/14/14 0532  NA 133* 133* 134*  K 3.4* 3.3* 3.6  CL 96 96 100  CO2 25  --  28  BUN 26* 30* 23  CREATININE 1.00 0.90 0.90  CALCIUM 9.2  --  8.8  PROT 6.6  --   --   BILITOT 0.7  --   --   ALKPHOS 62  --   --   ALT 19  --   --   AST 20  --   --   GLUCOSE 224* 221* 160*    UDS negative Ethanol negative  UA >1000 glucose, moderate hgb, large leukocytes  A1c 7.9 TSH 0.557 Lipid Panel     Component Value Date/Time   CHOL 179 12/14/2014 0550   TRIG 453* 12/14/2014 0550   HDL 27* 12/14/2014 0550   CHOLHDL 6.6 12/14/2014 0550   VLDL UNABLE TO CALCULATE IF TRIGLYCERIDE OVER 400 mg/dL 12/14/2014 0550   LDLCALC UNABLE TO CALCULATE IF TRIGLYCERIDE OVER 400 mg/dL 12/14/2014 0550     Imaging/Diagnostic Tests: Ct Head (brain) Wo Contrast  12/13/2014   CLINICAL DATA:  Altered mental status.  Weakness.  Hyperglycemia.  EXAM: CT HEAD WITHOUT CONTRAST  TECHNIQUE: Contiguous  axial images were obtained from the base of the skull through the vertex without intravenous contrast.  COMPARISON:  02/10/2013  FINDINGS: No evidence of intracranial hemorrhage, brain edema, or other signs of acute infarction. No evidence of intracranial mass lesion or mass effect.  No abnormal extraaxial fluid collections identified. Ventricles are normal in size. No skull abnormality identified.  IMPRESSION: Negative noncontrast head CT.   Electronically Signed   By: Earle Gell M.D.   On: 12/13/2014 11:58    Lavon Paganini, MD 12/14/2014, 9:42 AM PGY-1, [place] Intern pager: [phone], text pages welcome

## 2014-12-14 NOTE — Progress Notes (Signed)
UR completed 

## 2014-12-14 NOTE — Discharge Instructions (Signed)
You were admitted for fogginess and trouble speaking.  This may or may not have been a TIA.  You should follow-up with neurology as an outpatient.  All of your testing was normal here.  Diet and exercise are important.    Talk to your doctors about the Voltaren and the possiblitiy of switching to something other than an NSAID.  Transient Ischemic Attack A transient ischemic attack (TIA) is a "warning stroke" that causes stroke-like symptoms. Unlike a stroke, a TIA does not cause permanent damage to the brain. The symptoms of a TIA can happen very fast and do not last long. It is important to know the symptoms of a TIA and what to do. This can help prevent a major stroke or death. CAUSES   A TIA is caused by a temporary blockage in an artery in the brain or neck (carotid artery). The blockage does not allow the brain to get the blood supply it needs and can cause different symptoms. The blockage can be caused by either:  A blood clot.  Fatty buildup (plaque) in a neck or brain artery. RISK FACTORS  High blood pressure (hypertension).  High cholesterol.  Diabetes mellitus.  Heart disease.  The build up of plaque in the blood vessels (peripheral artery disease or atherosclerosis).  The build up of plaque in the blood vessels providing blood and oxygen to the brain (carotid artery stenosis).  An abnormal heart rhythm (atrial fibrillation).  Obesity.  Smoking.  Taking oral contraceptives (especially in combination with smoking).  Physical inactivity.  A diet high in fats, salt (sodium), and calories.  Alcohol use.  Use of illegal drugs (especially cocaine and methamphetamine).  Being female.  Being African American.  Being over the age of 20.  Family history of stroke.  Previous history of blood clots, stroke, TIA, or heart attack.  Sickle cell disease. SYMPTOMS  TIA symptoms are the same as a stroke but are temporary. These symptoms usually develop suddenly, or may be  newly present upon awakening from sleep:  Sudden weakness or numbness of the face, arm, or leg, especially on one side of the body.  Sudden trouble walking or difficulty moving arms or legs.  Sudden confusion.  Sudden personality changes.  Trouble speaking (aphasia) or understanding.  Difficulty swallowing.  Sudden trouble seeing in one or both eyes.  Double vision.  Dizziness.  Loss of balance or coordination.  Sudden severe headache with no known cause.  Trouble reading or writing.  Loss of bowel or bladder control.  Loss of consciousness. DIAGNOSIS  Your caregiver may be able to determine the presence or absence of a TIA based on your symptoms, history, and physical exam. Computed tomography (CT scan) of the brain is usually performed to help identify a TIA. Other tests may be done to diagnose a TIA. These tests may include:  Electrocardiography.  Continuous heart monitoring.  Echocardiography.  Carotid ultrasonography.  Magnetic resonance imaging (MRI).  A scan of the brain circulation.  Blood tests. PREVENTION  The risk of a TIA can be decreased by appropriately treating high blood pressure, high cholesterol, diabetes, heart disease, and obesity and by quitting smoking, limiting alcohol, and staying physically active. TREATMENT  Time is of the essence. Since the symptoms of TIA are the same as a stroke, it is important to seek treatment as soon as possible because you may need a medicine to dissolve the clot (thrombolytic) that cannot be given if too much time has passed. Treatment options vary. Treatment  options may include rest, oxygen, intravenous (IV) fluids, and medicines to thin the blood (anticoagulants). Medicines and diet may be used to address diabetes, high blood pressure, and other risk factors. Measures will be taken to prevent short-term and long-term complications, including infection from breathing foreign material into the lungs (aspiration  pneumonia), blood clots in the legs, and falls. Treatment options include procedures to either remove plaque in the carotid arteries or dilate carotid arteries that have narrowed due to plaque. Those procedures are:  Carotid endarterectomy.  Carotid angioplasty and stenting. HOME CARE INSTRUCTIONS   Take all medicines prescribed by your caregiver. Follow the directions carefully. Medicines may be used to control risk factors for a stroke. Be sure you understand all your medicine instructions.  You may be told to take aspirin or the anticoagulant warfarin. Warfarin needs to be taken exactly as instructed.  Taking too much or too little warfarin is dangerous. Too much warfarin increases the risk of bleeding. Too little warfarin continues to allow the risk for blood clots. While taking warfarin, you will need to have regular blood tests to measure your blood clotting time. A PT blood test measures how long it takes for blood to clot. Your PT is used to calculate another value called an INR. Your PT and INR help your caregiver to adjust your dose of warfarin. The dose can change for many reasons. It is critically important that you take warfarin exactly as prescribed.  Many foods, especially foods high in vitamin K can interfere with warfarin and affect the PT and INR. Foods high in vitamin K include spinach, kale, broccoli, cabbage, collard and turnip greens, brussels sprouts, peas, cauliflower, seaweed, and parsley as well as beef and pork liver, green tea, and soybean oil. You should eat a consistent amount of foods high in vitamin K. Avoid major changes in your diet, or notify your caregiver before changing your diet. Arrange a visit with a dietitian to answer your questions.  Many medicines can interfere with warfarin and affect the PT and INR. You must tell your caregiver about any and all medicines you take, this includes all vitamins and supplements. Be especially cautious with aspirin and  anti-inflammatory medicines. Do not take or discontinue any prescribed or over-the-counter medicine except on the advice of your caregiver or pharmacist.  Warfarin can have side effects, such as excessive bruising or bleeding. You will need to hold pressure over cuts for longer than usual. Your caregiver or pharmacist will discuss other potential side effects.  Avoid sports or activities that may cause injury or bleeding.  Be mindful when shaving, flossing your teeth, or handling sharp objects.  Alcohol can change the body's ability to handle warfarin. It is best to avoid alcoholic drinks or consume only very small amounts while taking warfarin. Notify your caregiver if you change your alcohol intake.  Notify your dentist or other caregivers before procedures.  Eat a diet that includes 5 or more servings of fruits and vegetables each day. This may reduce the risk of stroke. Certain diets may be prescribed to address high blood pressure, high cholesterol, diabetes, or obesity.  A low-sodium, low-saturated fat, low-trans fat, low-cholesterol diet is recommended to manage high blood pressure.  A low-saturated fat, low-trans fat, low-cholesterol, and high-fiber diet may control cholesterol levels.  A controlled-carbohydrate, controlled-sugar diet is recommended to manage diabetes.  A reduced-calorie, low-sodium, low-saturated fat, low-trans fat, low-cholesterol diet is recommended to manage obesity.  Maintain a healthy weight.  Stay physically active. It  is recommended that you get at least 30 minutes of activity on most or all days.  Do not smoke.  Limit alcohol use even if you are not taking warfarin. Moderate alcohol use is considered to be:  No more than 2 drinks each day for men.  No more than 1 drink each day for nonpregnant women.  Stop drug abuse.  Home safety. A safe home environment is important to reduce the risk of falls. Your caregiver may arrange for specialists to  evaluate your home. Having grab bars in the bedroom and bathroom is often important. Your caregiver may arrange for equipment to be used at home, such as raised toilets and a seat for the shower.  Follow all instructions for follow-up with your caregiver. This is very important. This includes any referrals and lab tests. Proper follow up can prevent a stroke or another TIA from occurring. SEEK MEDICAL CARE IF:  You have personality changes.  You have difficulty swallowing.  You are seeing double.  You have dizziness.  You have a fever.  You have skin breakdown. SEEK IMMEDIATE MEDICAL CARE IF:  Any of these symptoms may represent a serious problem that is an emergency. Do not wait to see if the symptoms will go away. Get medical help right away. Call your local emergency services (911 in [place] Do not drive yourself to the hospital.  You have sudden weakness or numbness of the face, arm, or leg, especially on one side of the body.  You have sudden trouble walking or difficulty moving arms or legs.  You have sudden confusion.  You have trouble speaking (aphasia) or understanding.  You have sudden trouble seeing in one or both eyes.  You have a loss of balance or coordination.  You have a sudden, severe headache with no known cause.  You have new chest pain or an irregular heartbeat.  You have a partial or total loss of consciousness. MAKE SURE YOU:   Understand these instructions.  Will watch your condition.  Will get help right away if you are not doing well or get worse. Document Released: 09/03/2005 Document Revised: 11/29/2013 Document Reviewed: 03/01/2014 [facility] Patient Information 2015 [place], [place]. This information is not intended to replace advice given to you by your health care provider. Make sure you discuss any questions you have with your health care provider.

## 2014-12-14 NOTE — Progress Notes (Signed)
*  PRELIMINARY RESULTS* Vascular Ultrasound Carotid Duplex (Doppler) has been completed.  Preliminary findings: Bilateral:  1-39% ICA stenosis.  Vertebral artery flow is antegrade.      Landry Mellow, RDMS, RVT  12/14/2014, 11:18 AM

## 2014-12-15 ENCOUNTER — Telehealth: Payer: Self-pay | Admitting: Family Medicine

## 2014-12-15 LAB — URINE CULTURE

## 2014-12-15 MED ORDER — CEPHALEXIN 500 MG PO CAPS: 500.0000 mg | ORAL_CAPSULE | Freq: Three times a day (TID) | ORAL | Status: DC

## 2014-12-15 NOTE — Telephone Encounter (Signed)
Called patient and left voicemail asking to call [facility] after hours line [phone] regarding lab result from hospitalization. Pt found to have klebsiella UTI, rx sent in for keflex TID x 7 days. When pt returns phone call please let her know this result. -Dr. Lamar Benes

## 2014-12-15 NOTE — Telephone Encounter (Signed)
Patient called back and I informed her of the urine culture results. Advised that there is an antibiotic to be picked up at her pharmacy. She reports she is doing well.

## 2015-01-11 ENCOUNTER — Ambulatory Visit (INDEPENDENT_AMBULATORY_CARE_PROVIDER_SITE_OTHER): Payer: BC Managed Care – PPO | Admitting: Women's Health

## 2015-01-11 ENCOUNTER — Encounter: Payer: Self-pay | Admitting: Women's Health

## 2015-01-11 VITALS — BP 130/80 | Ht 64.0 in | Wt 265.0 lb

## 2015-01-11 DIAGNOSIS — B373 Candidiasis of vulva and vagina: Secondary | ICD-10-CM

## 2015-01-11 DIAGNOSIS — B3731 Acute candidiasis of vulva and vagina: Secondary | ICD-10-CM

## 2015-01-11 DIAGNOSIS — R350 Frequency of micturition: Secondary | ICD-10-CM

## 2015-01-11 LAB — WET PREP FOR TRICH, YEAST, CLUE
Clue Cells Wet Prep HPF POC: NONE SEEN
TRICH WET PREP: NONE SEEN

## 2015-01-11 LAB — URINALYSIS W MICROSCOPIC + REFLEX CULTURE
BILIRUBIN URINE: NEGATIVE
CASTS: NONE SEEN
Crystals: NONE SEEN
Glucose, UA: >1000 mg/dL — AB
Ketones, ur: NEGATIVE mg/dL
LEUKOCYTES UA: NEGATIVE
Nitrite: NEGATIVE
PH: 5 (ref 5.0–8.0)
PROTEIN: NEGATIVE mg/dL
RBC / HPF: NONE SEEN RBC/hpf (ref <3)
SPECIFIC GRAVITY, URINE: 1.01 (ref 1.005–1.030)
UROBILINOGEN UA: 0.2 mg/dL (ref 0.0–1.0)

## 2015-01-11 MED ORDER — NYSTATIN-TRIAMCINOLONE 100000-0.1 UNIT/GM-% EX OINT: 1.0000 "application " | TOPICAL_OINTMENT | Freq: Two times a day (BID) | CUTANEOUS | Status: DC

## 2015-01-11 MED ORDER — FLUCONAZOLE 150 MG PO TABS: 150.0000 mg | ORAL_TABLET | Freq: Once | ORAL | Status: DC

## 2015-01-11 NOTE — Patient Instructions (Signed)

## 2015-01-11 NOTE — Progress Notes (Signed)
Patient ID: Lydia [name], female   DOB: 1961-05-15, 54 y.o.   MRN: [id number] Presents with complaint of intense vaginal itching, external irritation with rash, increased discharge for 2 weeks. Had been treated for UTI with antibiotic. Type II diabetic on several medications. Having difficulty controlling blood sugar. Amenorrheic on Mirena IUD placed 10/2012. History of PCO OS. Process of losing weight, weight today 265, has lost 40 pounds in the past 6 months with diet and exercise. 12/13/2014 TIA,  hemoglobin A1c 7.9. Hypertension/osteoarthritis/hyperlipidemia primary care manages. First grade teacher.  Exam: Appears well. External genitalia: Labia majora extending to introitus excoriated no visible drainage. Speculum exam erythematous with scant white discharge Mirena IUD strings visible. Bimanual limited due to obesity nontender except for external  skin. Wet prep positive for yeast. UA: Glucose greater than 1000, trace blood, 0-2 WBCs, few bacteria.  Yeast vaginitis Poorly controlled diabetes Amenorrheic Mirena IUD  Plan: Diflucan 150 by mouth 1 dose, Mycolog ointment small amount externally twice daily, loose clothing, instructed to call  if symptoms do not resolve and external skin does not appear to be healing. Urine culture pending. Reviewed importance of blood sugar control in relationship to yeast and health.

## 2015-01-15 LAB — URINE CULTURE: Colony Count: >=100000

## 2015-01-17 ENCOUNTER — Other Ambulatory Visit: Payer: Self-pay | Admitting: Women's Health

## 2015-01-17 MED ORDER — CIPROFLOXACIN HCL 250 MG PO TABS: 250.0000 mg | ORAL_TABLET | Freq: Two times a day (BID) | ORAL | Status: DC

## 2015-04-17 ENCOUNTER — Other Ambulatory Visit: Payer: Self-pay | Admitting: Orthopedic Surgery

## 2015-04-23 ENCOUNTER — Encounter (HOSPITAL_COMMUNITY): Payer: Self-pay

## 2015-04-23 ENCOUNTER — Encounter (HOSPITAL_COMMUNITY)
Admission: RE | Admit: 2015-04-23 | Discharge: 2015-04-23 | Disposition: A | Discharge status: Home / Self Care | Payer: BC Managed Care – PPO | Source: Ambulatory Visit | Attending: Orthopedic Surgery | Admitting: Orthopedic Surgery

## 2015-04-23 DIAGNOSIS — Z0183 Encounter for blood typing: Secondary | ICD-10-CM | POA: Diagnosis not present

## 2015-04-23 DIAGNOSIS — Z01812 Encounter for preprocedural laboratory examination: Secondary | ICD-10-CM | POA: Insufficient documentation

## 2015-04-23 DIAGNOSIS — M1990 Unspecified osteoarthritis, unspecified site: Secondary | ICD-10-CM | POA: Diagnosis not present

## 2015-04-23 HISTORY — DX: Family history of other specified conditions: Z84.89

## 2015-04-23 HISTORY — DX: Anxiety disorder, unspecified: F41.9

## 2015-04-23 HISTORY — DX: Unspecified asthma, uncomplicated: J45.909

## 2015-04-23 LAB — URINALYSIS, ROUTINE W REFLEX MICROSCOPIC
Bilirubin Urine: NEGATIVE
Glucose, UA: >1000 mg/dL — AB
Hgb urine dipstick: NEGATIVE
KETONES UR: NEGATIVE mg/dL
Leukocytes, UA: NEGATIVE
Nitrite: NEGATIVE
Protein, ur: NEGATIVE mg/dL
SPECIFIC GRAVITY, URINE: 1.029 (ref 1.005–1.030)
Urobilinogen, UA: 0.2 mg/dL (ref 0.0–1.0)
pH: 5 (ref 5.0–8.0)

## 2015-04-23 LAB — URINE MICROSCOPIC-ADD ON

## 2015-04-23 LAB — CBC WITH DIFFERENTIAL/PLATELET
BASOS PCT: 1 % (ref 0–1)
Basophils Absolute: 0.1 10*3/uL (ref 0.0–0.1)
Eosinophils Absolute: 0.6 10*3/uL (ref 0.0–0.7)
Eosinophils Relative: 7 % — ABNORMAL HIGH (ref 0–5)
HCT: 45 % (ref 36.0–46.0)
Hemoglobin: 15.3 g/dL — ABNORMAL HIGH (ref 12.0–15.0)
LYMPHS ABS: 3.1 10*3/uL (ref 0.7–4.0)
Lymphocytes Relative: 31 % (ref 12–46)
MCH: 29.4 pg (ref 26.0–34.0)
MCHC: 34 g/dL (ref 30.0–36.0)
MCV: 86.5 fL (ref 78.0–100.0)
Monocytes Absolute: 0.6 10*3/uL (ref 0.1–1.0)
Monocytes Relative: 6 % (ref 3–12)
Neutro Abs: 5.4 10*3/uL (ref 1.7–7.7)
Neutrophils Relative %: 55 % (ref 43–77)
PLATELETS: 268 10*3/uL (ref 150–400)
RBC: 5.2 MIL/uL — AB (ref 3.87–5.11)
RDW: 13.1 % (ref 11.5–15.5)
WBC: 9.8 10*3/uL (ref 4.0–10.5)

## 2015-04-23 LAB — PROTIME-INR
INR: 0.97 (ref 0.00–1.49)
Prothrombin Time: 13 seconds (ref 11.6–15.2)

## 2015-04-23 LAB — BASIC METABOLIC PANEL
Anion gap: 10 (ref 5–15)
BUN: 15 mg/dL (ref 6–20)
CALCIUM: 9 mg/dL (ref 8.9–10.3)
CO2: 25 mmol/L (ref 22–32)
Chloride: 103 mmol/L (ref 101–111)
Creatinine, Ser: 0.8 mg/dL (ref 0.44–1.00)
GFR calc Af Amer: >60 mL/min (ref >60)
GLUCOSE: 202 mg/dL — AB (ref 65–99)
POTASSIUM: 3.8 mmol/L (ref 3.5–5.1)
Sodium: 138 mmol/L (ref 135–145)

## 2015-04-23 LAB — SURGICAL PCR SCREEN
MRSA, PCR: NEGATIVE
Staphylococcus aureus: NEGATIVE

## 2015-04-23 LAB — TYPE AND SCREEN
ABO/RH(D): O POS
ANTIBODY SCREEN: NEGATIVE

## 2015-04-23 LAB — APTT: APTT: 27 s (ref 24–37)

## 2015-04-23 LAB — ABO/RH: ABO/RH(D): O POS

## 2015-04-23 NOTE — Progress Notes (Addendum)
02/2013 had TIA, having expressive asphagia.  No residual deficits. but was worked up.  Doesn't remember who she saw then.  Denies any cardiac issues.... But had another episode in 12/2014.  Had echo, carotid dopplers, other testing, again asphagia which has cleared.  Has been instructed to take the baby aspirin.  No follow up visits with and no other problems noted.   PCP is Dr. Rosalyn Gess @  [place].   DA

## 2015-04-23 NOTE — Pre-Procedure Instructions (Signed)
Lydia [name]  04/23/2015   Your procedure is scheduled on:  Monday, May 23rd.   Report to [facility] Admitting at 10:30 AM.  Call this number if you have problems the morning of surgery: [phone]   Remember:   Do not eat food or drink liquids after midnight Sunday.   Take these medicines the morning of surgery with A SIP OF WATER: No medications, however please use your inhaler the morning of surgery.             Take NO anti-inflammatories 4-5 days prior to surgery.   Do not wear jewelry, make-up or nail polish.  Do not wear lotions, powders, or perfumes. You may NOT  wear deodorant the day of surgery.  Do not shave underarms & legs 48 hours prior to surgery.   Do not bring valuables to the hospital.  [facility] is not responsible                  for any belongings or valuables.               Contacts, dentures or bridgework may not be worn into surgery.  Leave suitcase in the car. After surgery it may be brought to your room.  For patients admitted to the hospital, discharge time is determined by your treatment team.    Name and phone number of your driver:    Special Instructions: "Preparing for Surgery" instruction sheet.   Please read over the following fact sheets that you were given: Pain Booklet, Coughing and Deep Breathing, Blood Transfusion Information, MRSA Information and Surgical Site Infection Prevention

## 2015-04-24 LAB — HEMOGLOBIN A1C
Hgb A1c MFr Bld: 7.1 % — ABNORMAL HIGH (ref 4.8–5.6)
Mean Plasma Glucose: 157 mg/dL

## 2015-04-27 ENCOUNTER — Encounter (HOSPITAL_COMMUNITY): Payer: Self-pay | Admitting: Certified Registered Nurse Anesthetist

## 2015-04-27 NOTE — Progress Notes (Signed)
Instructed patient to arrive at 0900 on Monday due to surgery change.  Pt voiced understanding.

## 2015-04-27 NOTE — H&P (Signed)
TOTAL KNEE ADMISSION H&P  Patient is being admitted for left total knee arthroplasty.  Subjective:  Chief Complaint:left knee pain.  HPI: Lydia [name], 54 y.o. female, has a history of pain and functional disability in the left knee due to arthritis and has failed non-surgical conservative treatments for greater than 12 weeks to includeNSAID's and/or analgesics, corticosteriod injections, viscosupplementation injections, weight reduction as appropriate and activity modification.  Onset of symptoms was gradual, starting 3 years ago with gradually worsening course since that time. The patient noted no past surgery on the left knee(s).  Patient currently rates pain in the left knee(s) at 10 out of 10 with activity. Patient has night pain, worsening of pain with activity and weight bearing, pain that interferes with activities of daily living, pain with passive range of motion and crepitus.  Patient has evidence of subchondral sclerosis, joint subluxation and joint space narrowing by imaging studies.  There is no active infection.  Patient Active Problem List   Diagnosis Date Noted  . TIA (transient ischemic attack) 02/10/2013  . Hyperlipidemia 02/10/2013  . Osteoarthritis 02/10/2013  . History of PCOS 10/29/2012  . HTN (hypertension), benign 10/11/2012  . Diabetes mellitus, type II 10/11/2012   Past Medical History  Diagnosis Date  . Diabetes mellitus   . PCOS (polycystic ovarian syndrome)   . Hypertension   . Osteoarthritis   . Family history of adverse reaction to anesthesia     5 YR SON  HAD HERNIA REPAIR AND GOT NAUSEATED AFTER  . Asthma     SEASONAL  ASTHMA,  COMES ON WITH RESPIRATORY INFECTIONS.  Marland Kitchen Anxiety     Past Surgical History  Procedure Laterality Date  . Cesarean section  2001  . Tonsillectomy      No prescriptions prior to admission   Allergies  Allergen Reactions  . Adhesive [Tape] Rash    History  Substance Use Topics  . Smoking status: Former Smoker     Quit date: 05/11/2012  . Smokeless tobacco: Never Used  . Alcohol Use: Yes     Comment: RARE TO OCCASIONALLY    Family History  Problem Relation Age of Onset  . Hypertension Father   . Cancer Father     PROSTATE  . Diabetes Paternal Aunt   . Hypertension Paternal Aunt   . Cancer Paternal Aunt     pancreatic  . Diabetes Paternal Grandmother   . Cancer Paternal Grandmother     leukemia     Review of Systems  Constitutional: Negative.   HENT: Negative.   Eyes: Negative.        Glasses  Respiratory: Negative.        Asthma  Cardiovascular: Negative.   Gastrointestinal: Negative.   Genitourinary: Negative.   Musculoskeletal: Positive for joint pain.  Skin: Negative.   Neurological: Negative.   Endo/Heme/Allergies: Negative.   Psychiatric/Behavioral: Negative.     Objective:  Physical Exam  Constitutional: She is oriented to person, place, and time. She appears well-developed and well-nourished.  HENT:  Head: Normocephalic and atraumatic.  Eyes: Pupils are equal, round, and reactive to light.  Neck: Normal range of motion. Neck supple.  Cardiovascular: Intact distal pulses.   Respiratory: Effort normal.  Musculoskeletal: She exhibits tenderness.    The left knee has not obvious varus deformity, 10 flexion contracture, flexes 120 limited by adipose tissue.    Neurological: She is alert and oriented to person, place, and time.  Skin: Skin is warm and dry.  Psychiatric: She has  a normal mood and affect. Her behavior is normal. Judgment and thought content normal.    Vital signs in last 24 hours:    Labs:   Estimated body mass index is 45.46 kg/(m^2) as calculated from the following:   Height as of 01/11/15: 5\' 4"  (1.626 m).   Weight as of 01/11/15: 120.203 kg (265 lb).   Imaging Review Plain radiographs demonstrate her left knee which has bone-on-bone end-stage arthritis as well as a 1 cm of subluxation of the tibia lateral to the femur with varus  deformity.  Assessment/Plan:  End stage arthritis, left knee   The patient history, physical examination, clinical judgment of the provider and imaging studies are consistent with end stage degenerative joint disease of the left knee(s) and total knee arthroplasty is deemed medically necessary. The treatment options including medical management, injection therapy arthroscopy and arthroplasty were discussed at length. The risks and benefits of total knee arthroplasty were presented and reviewed. The risks due to aseptic loosening, infection, stiffness, patella tracking problems, thromboembolic complications and other imponderables were discussed. The patient acknowledged the explanation, agreed to proceed with the plan and consent was signed. Patient is being admitted for inpatient treatment for surgery, pain control, PT, OT, prophylactic antibiotics, VTE prophylaxis, progressive ambulation and ADL's and discharge planning. The patient is planning to be discharged home with home health services

## 2015-04-27 NOTE — Progress Notes (Signed)
Left message to inform patient of surgery time change and for patient to arrive at 09:00.  Asked patient to return call to confirm time change.

## 2015-04-29 ENCOUNTER — Emergency Department (HOSPITAL_COMMUNITY): Payer: BC Managed Care – PPO | Admitting: Certified Registered"

## 2015-04-29 ENCOUNTER — Emergency Department (HOSPITAL_COMMUNITY): Payer: BC Managed Care – PPO

## 2015-04-29 ENCOUNTER — Inpatient Hospital Stay (HOSPITAL_COMMUNITY)
Admission: EM | Admit: 2015-04-29 | Discharge: 2015-05-02 | DRG: 872 | Disposition: A | Discharge status: Home / Self Care | Payer: BC Managed Care – PPO | Attending: Urology | Admitting: Urology

## 2015-04-29 ENCOUNTER — Encounter (HOSPITAL_COMMUNITY): Payer: Self-pay | Admitting: General Practice

## 2015-04-29 ENCOUNTER — Encounter (HOSPITAL_COMMUNITY)
Admission: EM | Disposition: A | Discharge status: Home / Self Care | Payer: Self-pay | Source: Home / Self Care | Attending: Urology

## 2015-04-29 DIAGNOSIS — N200 Calculus of kidney: Secondary | ICD-10-CM

## 2015-04-29 DIAGNOSIS — I10 Essential (primary) hypertension: Secondary | ICD-10-CM | POA: Diagnosis present

## 2015-04-29 DIAGNOSIS — B961 Klebsiella pneumoniae [K. pneumoniae] as the cause of diseases classified elsewhere: Secondary | ICD-10-CM | POA: Diagnosis present

## 2015-04-29 DIAGNOSIS — Z8249 Family history of ischemic heart disease and other diseases of the circulatory system: Secondary | ICD-10-CM | POA: Diagnosis not present

## 2015-04-29 DIAGNOSIS — F17210 Nicotine dependence, cigarettes, uncomplicated: Secondary | ICD-10-CM | POA: Diagnosis present

## 2015-04-29 DIAGNOSIS — Z975 Presence of (intrauterine) contraceptive device: Secondary | ICD-10-CM | POA: Diagnosis not present

## 2015-04-29 DIAGNOSIS — E119 Type 2 diabetes mellitus without complications: Secondary | ICD-10-CM | POA: Diagnosis present

## 2015-04-29 DIAGNOSIS — M179 Osteoarthritis of knee, unspecified: Secondary | ICD-10-CM | POA: Diagnosis present

## 2015-04-29 DIAGNOSIS — A419 Sepsis, unspecified organism: Secondary | ICD-10-CM | POA: Diagnosis present

## 2015-04-29 DIAGNOSIS — N390 Urinary tract infection, site not specified: Secondary | ICD-10-CM | POA: Diagnosis present

## 2015-04-29 DIAGNOSIS — N23 Unspecified renal colic: Secondary | ICD-10-CM | POA: Diagnosis present

## 2015-04-29 DIAGNOSIS — N201 Calculus of ureter: Secondary | ICD-10-CM

## 2015-04-29 DIAGNOSIS — N132 Hydronephrosis with renal and ureteral calculous obstruction: Secondary | ICD-10-CM | POA: Diagnosis present

## 2015-04-29 DIAGNOSIS — Z6841 Body Mass Index (BMI) 40.0 and over, adult: Secondary | ICD-10-CM | POA: Diagnosis not present

## 2015-04-29 HISTORY — PX: CYSTOSCOPY W/ URETERAL STENT PLACEMENT: SHX1429

## 2015-04-29 LAB — CBC WITH DIFFERENTIAL/PLATELET
BASOS PCT: 0 % (ref 0–1)
Basophils Absolute: 0 10*3/uL (ref 0.0–0.1)
Eosinophils Absolute: 0.1 10*3/uL (ref 0.0–0.7)
Eosinophils Relative: 1 % (ref 0–5)
HEMATOCRIT: 47.1 % — AB (ref 36.0–46.0)
Hemoglobin: 16.4 g/dL — ABNORMAL HIGH (ref 12.0–15.0)
LYMPHS PCT: 10 % — AB (ref 12–46)
Lymphs Abs: 1.8 10*3/uL (ref 0.7–4.0)
MCH: 29.5 pg (ref 26.0–34.0)
MCHC: 34.8 g/dL (ref 30.0–36.0)
MCV: 84.7 fL (ref 78.0–100.0)
MONO ABS: 1.1 10*3/uL — AB (ref 0.1–1.0)
MONOS PCT: 6 % (ref 3–12)
NEUTROS PCT: 83 % — AB (ref 43–77)
Neutro Abs: 14.1 10*3/uL — ABNORMAL HIGH (ref 1.7–7.7)
Platelets: 296 10*3/uL (ref 150–400)
RBC: 5.56 MIL/uL — ABNORMAL HIGH (ref 3.87–5.11)
RDW: 12.9 % (ref 11.5–15.5)
WBC: 17.1 10*3/uL — ABNORMAL HIGH (ref 4.0–10.5)

## 2015-04-29 LAB — URINALYSIS, ROUTINE W REFLEX MICROSCOPIC
Bilirubin Urine: NEGATIVE
Bilirubin Urine: NEGATIVE
Glucose, UA: >1000 mg/dL — AB
Glucose, UA: >1000 mg/dL — AB
KETONES UR: 15 mg/dL — AB
Ketones, ur: 15 mg/dL — AB
Leukocytes, UA: NEGATIVE
Leukocytes, UA: NEGATIVE
NITRITE: POSITIVE — AB
Nitrite: NEGATIVE
PROTEIN: NEGATIVE mg/dL
Protein, ur: NEGATIVE mg/dL
SPECIFIC GRAVITY, URINE: 1.025 (ref 1.005–1.030)
Specific Gravity, Urine: 1.034 — ABNORMAL HIGH (ref 1.005–1.030)
Urobilinogen, UA: 0.2 mg/dL (ref 0.0–1.0)
Urobilinogen, UA: 0.2 mg/dL (ref 0.0–1.0)
pH: 5 (ref 5.0–8.0)
pH: 5 (ref 5.0–8.0)

## 2015-04-29 LAB — GLUCOSE, CAPILLARY: Glucose-Capillary: 180 mg/dL — ABNORMAL HIGH (ref 65–99)

## 2015-04-29 LAB — COMPREHENSIVE METABOLIC PANEL
ALT: 11 U/L — AB (ref 14–54)
AST: 20 U/L (ref 15–41)
Albumin: 3.7 g/dL (ref 3.5–5.0)
Alkaline Phosphatase: 81 U/L (ref 38–126)
Anion gap: 12 (ref 5–15)
BUN: 13 mg/dL (ref 6–20)
CALCIUM: 9.3 mg/dL (ref 8.9–10.3)
CO2: 23 mmol/L (ref 22–32)
CREATININE: 0.95 mg/dL (ref 0.44–1.00)
Chloride: 101 mmol/L (ref 101–111)
Glucose, Bld: 201 mg/dL — ABNORMAL HIGH (ref 65–99)
Potassium: 4 mmol/L (ref 3.5–5.1)
Sodium: 136 mmol/L (ref 135–145)
TOTAL PROTEIN: 7.2 g/dL (ref 6.5–8.1)
Total Bilirubin: 1.2 mg/dL (ref 0.3–1.2)

## 2015-04-29 LAB — URINE MICROSCOPIC-ADD ON

## 2015-04-29 LAB — LIPASE, BLOOD: Lipase: 15 U/L — ABNORMAL LOW (ref 22–51)

## 2015-04-29 LAB — POC URINE PREG, ED: PREG TEST UR: NEGATIVE

## 2015-04-29 SURGERY — CYSTOSCOPY, WITH RETROGRADE PYELOGRAM AND URETERAL STENT INSERTION
Anesthesia: General | Laterality: Right

## 2015-04-29 MED ORDER — PROPOFOL 10 MG/ML IV BOLUS
INTRAVENOUS | Status: AC
  Filled 2015-04-29: qty 20

## 2015-04-29 MED ORDER — PROMETHAZINE HCL 25 MG/ML IJ SOLN: 6.2500 mg | INTRAMUSCULAR | Status: DC | PRN

## 2015-04-29 MED ORDER — DEXAMETHASONE SODIUM PHOSPHATE 10 MG/ML IJ SOLN
INTRAMUSCULAR | Status: DC | PRN
  Administered 2015-04-29: 10 mg via INTRAVENOUS

## 2015-04-29 MED ORDER — DEXAMETHASONE SODIUM PHOSPHATE 10 MG/ML IJ SOLN
INTRAMUSCULAR | Status: AC
  Filled 2015-04-29: qty 1

## 2015-04-29 MED ORDER — ONDANSETRON HCL 4 MG/2ML IJ SOLN
INTRAMUSCULAR | Status: AC
  Filled 2015-04-29: qty 2

## 2015-04-29 MED ORDER — ALBUTEROL SULFATE (2.5 MG/3ML) 0.083% IN NEBU: 3.0000 mL | INHALATION_SOLUTION | Freq: Four times a day (QID) | RESPIRATORY_TRACT | Status: DC | PRN

## 2015-04-29 MED ORDER — LISINOPRIL 20 MG PO TABS
20.0000 mg | ORAL_TABLET | Freq: Every day | ORAL | Status: DC
  Administered 2015-04-30 – 2015-05-02 (×3): 20 mg via ORAL
  Filled 2015-04-29 (×3): qty 1

## 2015-04-29 MED ORDER — PHENYLEPHRINE HCL 10 MG/ML IJ SOLN
INTRAMUSCULAR | Status: DC | PRN
  Administered 2015-04-29: 80 ug via INTRAVENOUS

## 2015-04-29 MED ORDER — HYDROCHLOROTHIAZIDE 25 MG PO TABS
25.0000 mg | ORAL_TABLET | Freq: Every day | ORAL | Status: DC
  Administered 2015-04-30 – 2015-05-02 (×3): 25 mg via ORAL
  Filled 2015-04-29 (×3): qty 1

## 2015-04-29 MED ORDER — SUCCINYLCHOLINE CHLORIDE 20 MG/ML IJ SOLN
INTRAMUSCULAR | Status: DC | PRN
Start: 2015-04-29 — End: 2015-04-29
  Administered 2015-04-29: 100 mg via INTRAVENOUS

## 2015-04-29 MED ORDER — PHENYLEPHRINE 40 MCG/ML (10ML) SYRINGE FOR IV PUSH (FOR BLOOD PRESSURE SUPPORT)
PREFILLED_SYRINGE | INTRAVENOUS | Status: AC
  Filled 2015-04-29: qty 10

## 2015-04-29 MED ORDER — LACTATED RINGERS IV SOLN: INTRAVENOUS | Status: DC

## 2015-04-29 MED ORDER — SODIUM CHLORIDE 0.9 % IV SOLN
INTRAVENOUS | Status: DC
  Administered 2015-04-29 – 2015-05-01 (×3): via INTRAVENOUS

## 2015-04-29 MED ORDER — ONDANSETRON HCL 4 MG/2ML IJ SOLN
INTRAMUSCULAR | Status: DC | PRN
  Administered 2015-04-29: 4 mg via INTRAVENOUS

## 2015-04-29 MED ORDER — HYDROMORPHONE HCL 1 MG/ML IJ SOLN: 0.2500 mg | INTRAMUSCULAR | Status: DC | PRN

## 2015-04-29 MED ORDER — DIPHENHYDRAMINE HCL 12.5 MG/5ML PO ELIX: 12.5000 mg | ORAL_SOLUTION | Freq: Four times a day (QID) | ORAL | Status: DC | PRN

## 2015-04-29 MED ORDER — LACTATED RINGERS IV SOLN
INTRAVENOUS | Status: DC | PRN
  Administered 2015-04-29: 18:00:00 via INTRAVENOUS

## 2015-04-29 MED ORDER — FENTANYL CITRATE (PF) 100 MCG/2ML IJ SOLN
INTRAMUSCULAR | Status: AC
  Filled 2015-04-29: qty 2

## 2015-04-29 MED ORDER — PROPOFOL 10 MG/ML IV BOLUS
INTRAVENOUS | Status: DC | PRN
  Administered 2015-04-29: 100 mg via INTRAVENOUS
  Administered 2015-04-29: 200 mg via INTRAVENOUS

## 2015-04-29 MED ORDER — ONDANSETRON HCL 4 MG/2ML IJ SOLN
4.0000 mg | INTRAMUSCULAR | Status: DC | PRN
  Administered 2015-05-01: 4 mg via INTRAVENOUS
  Filled 2015-04-29: qty 2

## 2015-04-29 MED ORDER — INSULIN ASPART 100 UNIT/ML ~~LOC~~ SOLN
4.0000 [IU] | Freq: Three times a day (TID) | SUBCUTANEOUS | Status: DC
  Administered 2015-04-30 – 2015-05-02 (×7): 4 [IU] via SUBCUTANEOUS

## 2015-04-29 MED ORDER — HYDROMORPHONE HCL 1 MG/ML IJ SOLN
1.0000 mg | Freq: Once | INTRAMUSCULAR | Status: AC
  Administered 2015-04-29: 1 mg via INTRAVENOUS
  Filled 2015-04-29: qty 1

## 2015-04-29 MED ORDER — IOHEXOL 300 MG/ML  SOLN
INTRAMUSCULAR | Status: DC | PRN
  Administered 2015-04-29: 50 mL via URETHRAL

## 2015-04-29 MED ORDER — OXYCODONE HCL 5 MG PO TABS
5.0000 mg | ORAL_TABLET | ORAL | Status: DC | PRN
  Administered 2015-05-01 (×2): 5 mg via ORAL
  Filled 2015-04-29 (×2): qty 1

## 2015-04-29 MED ORDER — SODIUM CHLORIDE 0.9 % IV SOLN: 1.0000 g | INTRAVENOUS | Status: DC

## 2015-04-29 MED ORDER — DIPHENHYDRAMINE HCL 50 MG/ML IJ SOLN: 12.5000 mg | Freq: Four times a day (QID) | INTRAMUSCULAR | Status: DC | PRN

## 2015-04-29 MED ORDER — LISINOPRIL-HYDROCHLOROTHIAZIDE 20-25 MG PO TABS: 1.0000 | ORAL_TABLET | Freq: Every day | ORAL | Status: DC

## 2015-04-29 MED ORDER — LIDOCAINE HCL (CARDIAC) 20 MG/ML IV SOLN
INTRAVENOUS | Status: AC
  Filled 2015-04-29: qty 5

## 2015-04-29 MED ORDER — MORPHINE SULFATE 2 MG/ML IJ SOLN: 2.0000 mg | INTRAMUSCULAR | Status: DC | PRN

## 2015-04-29 MED ORDER — SODIUM CHLORIDE 0.9 % IV SOLN
1.0000 g | Freq: Four times a day (QID) | INTRAVENOUS | Status: DC
  Administered 2015-04-29 – 2015-05-02 (×10): 1 g via INTRAVENOUS
  Filled 2015-04-29 (×11): qty 1000

## 2015-04-29 MED ORDER — GENTAMICIN SULFATE 40 MG/ML IJ SOLN
5.0000 mg/kg | INTRAVENOUS | Status: DC
  Administered 2015-04-29: 382 mg via INTRAVENOUS
  Filled 2015-04-29: qty 9.5

## 2015-04-29 MED ORDER — LIDOCAINE HCL (PF) 2 % IJ SOLN
INTRAMUSCULAR | Status: DC | PRN
  Administered 2015-04-29: 40 mg via INTRADERMAL

## 2015-04-29 MED ORDER — INSULIN ASPART 100 UNIT/ML ~~LOC~~ SOLN: 0.0000 [IU] | Freq: Every day | SUBCUTANEOUS | Status: DC

## 2015-04-29 MED ORDER — EPHEDRINE SULFATE 50 MG/ML IJ SOLN
INTRAMUSCULAR | Status: DC | PRN
  Administered 2015-04-29 (×2): 5 mg via INTRAVENOUS

## 2015-04-29 MED ORDER — DEXTROSE 5 % IV SOLN
3.0000 g | INTRAVENOUS | Status: DC
  Filled 2015-04-29: qty 3000

## 2015-04-29 MED ORDER — SODIUM CHLORIDE 0.9 % IR SOLN
Status: DC | PRN
  Administered 2015-04-29: 3000 mL via INTRAVESICAL

## 2015-04-29 MED ORDER — SODIUM CHLORIDE 0.9 % IV SOLN
INTRAVENOUS | Status: AC
  Filled 2015-04-29: qty 1.5

## 2015-04-29 MED ORDER — GENTAMICIN SULFATE 40 MG/ML IJ SOLN
380.0000 mg | INTRAVENOUS | Status: DC
  Administered 2015-04-30 – 2015-05-01 (×2): 380 mg via INTRAVENOUS
  Filled 2015-04-29 (×2): qty 9.5

## 2015-04-29 MED ORDER — KETOROLAC TROMETHAMINE 30 MG/ML IJ SOLN
30.0000 mg | Freq: Once | INTRAMUSCULAR | Status: AC
  Administered 2015-04-29: 30 mg via INTRAVENOUS
  Filled 2015-04-29: qty 1

## 2015-04-29 MED ORDER — SODIUM CHLORIDE 0.9 % IJ SOLN
INTRAMUSCULAR | Status: AC
  Filled 2015-04-29: qty 10

## 2015-04-29 MED ORDER — GENTAMICIN SULFATE 40 MG/ML IJ SOLN
5.0000 mg/kg | Freq: Once | INTRAVENOUS | Status: DC
  Filled 2015-04-29: qty 9.5

## 2015-04-29 MED ORDER — INSULIN ASPART 100 UNIT/ML ~~LOC~~ SOLN
0.0000 [IU] | Freq: Three times a day (TID) | SUBCUTANEOUS | Status: DC
  Administered 2015-04-30 – 2015-05-01 (×4): 3 [IU] via SUBCUTANEOUS
  Administered 2015-05-01 – 2015-05-02 (×2): 2 [IU] via SUBCUTANEOUS
  Administered 2015-05-02 (×2): 3 [IU] via SUBCUTANEOUS

## 2015-04-29 MED ORDER — PNEUMOCOCCAL VAC POLYVALENT 25 MCG/0.5ML IJ INJ
0.5000 mL | INJECTION | INTRAMUSCULAR | Status: AC
  Administered 2015-04-30: 0.5 mL via INTRAMUSCULAR
  Filled 2015-04-29 (×2): qty 0.5

## 2015-04-29 MED ORDER — ONDANSETRON HCL 4 MG/2ML IJ SOLN
4.0000 mg | Freq: Once | INTRAMUSCULAR | Status: AC
  Administered 2015-04-29: 4 mg via INTRAVENOUS
  Filled 2015-04-29: qty 2

## 2015-04-29 MED ORDER — ATORVASTATIN CALCIUM 10 MG PO TABS
10.0000 mg | ORAL_TABLET | Freq: Every day | ORAL | Status: DC
  Administered 2015-04-29 – 2015-05-01 (×3): 10 mg via ORAL
  Filled 2015-04-29 (×4): qty 1

## 2015-04-29 MED ORDER — FENTANYL CITRATE (PF) 100 MCG/2ML IJ SOLN
INTRAMUSCULAR | Status: DC | PRN
  Administered 2015-04-29 (×2): 50 ug via INTRAVENOUS

## 2015-04-29 MED ORDER — SODIUM CHLORIDE 0.9 % IV BOLUS (SEPSIS)
1000.0000 mL | Freq: Once | INTRAVENOUS | Status: AC
  Administered 2015-04-29: 1000 mL via INTRAVENOUS

## 2015-04-29 MED ORDER — MORPHINE SULFATE 4 MG/ML IJ SOLN
4.0000 mg | Freq: Once | INTRAMUSCULAR | Status: AC
  Administered 2015-04-29: 4 mg via INTRAVENOUS
  Filled 2015-04-29: qty 1

## 2015-04-29 MED ORDER — EPHEDRINE SULFATE 50 MG/ML IJ SOLN
INTRAMUSCULAR | Status: AC
  Filled 2015-04-29: qty 1

## 2015-04-29 MED ORDER — SENNOSIDES-DOCUSATE SODIUM 8.6-50 MG PO TABS
2.0000 | ORAL_TABLET | Freq: Every day | ORAL | Status: DC
  Administered 2015-04-29 – 2015-05-01 (×3): 2 via ORAL
  Filled 2015-04-29 (×3): qty 2

## 2015-04-29 MED ORDER — AMPICILLIN-SULBACTAM SODIUM 3 (2-1) G IJ SOLR
3.0000 g | INTRAMUSCULAR | Status: DC | PRN
  Administered 2015-04-29: 1.5 g via INTRAVENOUS

## 2015-04-29 SURGICAL SUPPLY — 20 items
BAG URO CATCHER STRL LF (DRAPE) ×3 IMPLANT
BASKET ZERO TIP NITINOL 2.4FR (BASKET) IMPLANT
CATH INTERMIT  6FR 70CM (CATHETERS) ×3 IMPLANT
CLOTH BEACON ORANGE TIMEOUT ST (SAFETY) ×3 IMPLANT
FIBER LASER FLEXIVA 1000 (UROLOGICAL SUPPLIES) IMPLANT
FIBER LASER FLEXIVA 200 (UROLOGICAL SUPPLIES) IMPLANT
FIBER LASER FLEXIVA 365 (UROLOGICAL SUPPLIES) IMPLANT
FIBER LASER FLEXIVA 550 (UROLOGICAL SUPPLIES) IMPLANT
FIBER LASER TRAC TIP (UROLOGICAL SUPPLIES) IMPLANT
GLOVE BIOGEL M 8.0 STRL (GLOVE) ×3 IMPLANT
GOWN STRL REUS W/ TWL XL LVL3 (GOWN DISPOSABLE) ×1 IMPLANT
GOWN STRL REUS W/TWL XL LVL3 (GOWN DISPOSABLE) ×5 IMPLANT
GUIDEWIRE ANG ZIPWIRE 038X150 (WIRE) IMPLANT
GUIDEWIRE STR DUAL SENSOR (WIRE) IMPLANT
MANIFOLD NEPTUNE II (INSTRUMENTS) ×3 IMPLANT
PACK CYSTO (CUSTOM PROCEDURE TRAY) ×3 IMPLANT
STENT URET 6FRX24 CONTOUR (STENTS) ×3 IMPLANT
TRAY FOLEY METER SIL LF 16FR (CATHETERS) ×3 IMPLANT
TUBING CONNECTING 10 (TUBING) ×2 IMPLANT
TUBING CONNECTING 10' (TUBING) ×1

## 2015-04-29 NOTE — Progress Notes (Signed)
Two rings noted on left third finger, yellow colored wedding ring and ring with large clear stone and small clear stones in cascade pattern.

## 2015-04-29 NOTE — Progress Notes (Signed)
ANTIBIOTIC CONSULT NOTE - INITIAL  Pharmacy Consult for gentamicin Indication: UTI  Allergies  Allergen Reactions  . Adhesive [Tape] Rash    Patient Measurements: Height: 5\' 4"  (162.6 cm) Weight: 240 lb (108.863 kg) IBW/kg (Calculated) : 54.7 Adjusted Body Weight: 77kg  Vital Signs: Temp: 98.4 F (36.9 C) (05/22 2015) Temp Source: Oral (05/22 1721) BP: 105/64 mmHg (05/22 2015) Pulse Rate: 103 (05/22 2015) Intake/Output from previous day:   Intake/Output from this shift: Total I/O In: 1450 [I.V.:1400; IV Piggyback:50] Out: 275 [Urine:275]  Labs:  Recent Labs  04/29/15 1221  WBC 17.1*  HGB 16.4*  PLT 296  CREATININE 0.95   Estimated Creatinine Clearance: 84.5 mL/min (by C-G formula based on Cr of 0.95). No results for input(s): VANCOTROUGH, VANCOPEAK, VANCORANDOM, GENTTROUGH, GENTPEAK, GENTRANDOM, TOBRATROUGH, TOBRAPEAK, TOBRARND, AMIKACINPEAK, AMIKACINTROU, AMIKACIN in the last 72 hours.   Microbiology: Recent Results (from the past 720 hour(s))  Surgical pcr screen     Status: None   Collection Time: 04/23/15  3:31 PM  Result Value Ref Range Status   MRSA, PCR NEGATIVE NEGATIVE Final   Staphylococcus aureus NEGATIVE NEGATIVE Final    Comment:        The Xpert SA Assay (FDA approved for NASAL specimens in patients over 63 years of age), is one component of a comprehensive surveillance program.  Test performance has been validated by [facility] for patients greater than or equal to 109 year old. It is not intended to diagnose infection nor to guide or monitor treatment.     Medical History: Past Medical History  Diagnosis Date  . Diabetes mellitus   . PCOS (polycystic ovarian syndrome)   . Hypertension   . Osteoarthritis   . Family history of adverse reaction to anesthesia     5 YR SON  HAD HERNIA REPAIR AND GOT NAUSEATED AFTER  . Asthma     SEASONAL  ASTHMA,  COMES ON WITH RESPIRATORY INFECTIONS.  Marland Kitchen Anxiety    Assessment: 54 YOF with R  ureteral stone s/p cystoscopy and stent placement.  Gentamicin given preop and orders for pharmacy to continue dosing post-op.  5/22 >> gentamicin 5/22 >> ampicillin 5/22 >> Unasyn x 1 preop   5/22 Urine:   Renal function is WNL WBC is elevated Tm = 100.8  Goal of Therapy:  Gentamicin trough level <2 mcg/ml (undetectable for extended interval)  Plan:   Gentamicin 380 mg IV q24h 24h from preop dose (5/23 at 18:00)  Check random level after 3rd dose if remains on gentamicin  Follow renal function  Ampicillin dose appropriate.   Doreene Eland, PharmD, BCPS.   Pager: [phone]  04/29/2015,8:32 PM

## 2015-04-29 NOTE — H&P (Signed)
H&P  Chief Complaint: Right obstructing ureteral stone  History of Present Illness: Lydia [name] is a 54 y.o. female with history of HTN and DM who presents with right renal colic pain and a CT scan illustrating a right sided obstructing 60mm urteteral stone.  Her pain began about 24 hours ago and progressively worsened until presenting the the ER this afternoon.  She was also feeling warm at home but no objective fevers.  In the ER, she was found to have a WBC = 17.1 and creatinine = 0.95.  U/A remarkable for nitrite positivity and numerous bacteria.  She has not had a fever in the ER but has had tachycardia to 120s and stable BP within normal limits.  She also started having shaking chills at the time I evaluated her.  No dysuria.  No hematuria.  Past Medical History  Diagnosis Date  . Diabetes mellitus   . PCOS (polycystic ovarian syndrome)   . Hypertension   . Osteoarthritis   . Family history of adverse reaction to anesthesia     5 YR SON  HAD HERNIA REPAIR AND GOT NAUSEATED AFTER  . Asthma     SEASONAL  ASTHMA,  COMES ON WITH RESPIRATORY INFECTIONS.  Marland Kitchen Anxiety     Past Surgical History  Procedure Laterality Date  . Cesarean section  2001  . Tonsillectomy      Home Medications:    Medication List    ASK your doctor about these medications        albuterol 108 (90 BASE) MCG/ACT inhaler  Commonly known as:  PROVENTIL HFA;VENTOLIN HFA  Inhale 2 puffs into the lungs every 6 (six) hours as needed for wheezing or shortness of breath.     aspirin 81 MG EC tablet  Take 1 tablet (81 mg total) by mouth daily.     atorvastatin 10 MG tablet  Commonly known as:  LIPITOR  Take 10 mg by mouth at bedtime.     canagliflozin 300 MG Tabs tablet  Commonly known as:  INVOKANA  Take 1 tablet (300 mg total) by mouth daily.     diclofenac 75 MG EC tablet  Commonly known as:  VOLTAREN  Take 75 mg by mouth 2 (two) times daily.     DULoxetine 60 MG capsule  Commonly known as:   CYMBALTA  Take 60 mg by mouth 2 (two) times daily.     levonorgestrel 20 MCG/24HR IUD  Commonly known as:  MIRENA  1 each by Intrauterine route once. Implanted fall 2013     lisinopril-hydrochlorothiazide 20-25 MG per tablet  Commonly known as:  PRINZIDE,ZESTORETIC  Take 1 tablet by mouth daily.     metFORMIN 1000 MG tablet  Commonly known as:  GLUCOPHAGE  Take 1,000 mg by mouth 2 (two) times daily with a meal.     sitaGLIPtin 100 MG tablet  Commonly known as:  JANUVIA  Take 100 mg by mouth daily.        Allergies:  Allergies  Allergen Reactions  . Adhesive [Tape] Rash    Family History  Problem Relation Age of Onset  . Hypertension Father   . Cancer Father     PROSTATE  . Diabetes Paternal Aunt   . Hypertension Paternal Aunt   . Cancer Paternal Aunt     pancreatic  . Diabetes Paternal Grandmother   . Cancer Paternal Grandmother     leukemia    Social History:  reports that she has been smoking Cigarettes.  She  has been smoking about 0.50 packs per day. She has never used smokeless tobacco. She reports that she drinks alcohol. She reports that she does not use illicit drugs.  ROS: A complete review of systems was performed.  All systems are negative except for pertinent findings as noted.  Physical Exam:  Vital signs in last 24 hours: Temp:  [98.6 F (37 C)-98.8 F (37.1 C)] 98.8 F (37.1 C) (05/22 1210) Pulse Rate:  [69-110] 107 (05/22 1615) Resp:  [18-22] 18 (05/22 1612) BP: (125-186)/(58-95) 138/61 mmHg (05/22 1615) SpO2:  [87 %-99 %] 87 % (05/22 1615) Weight:  [240 lb (108.863 kg)] 240 lb (108.863 kg) (05/22 1210) Constitutional:  Alert and oriented, No acute distress Cardiovascular: Regular rate and rhythm, No JVD Respiratory: Normal respiratory effort, Lungs clear bilaterally GI: Abdomen is soft, nontender, nondistended, no abdominal masses GU: No CVA tenderness Lymphatic: No lymphadenopathy Neurologic: Grossly intact, no focal  deficits Psychiatric: Normal mood and affect  Laboratory Data:   Recent Labs  04/29/15 1221  WBC 17.1*  HGB 16.4*  HCT 47.1*  PLT 296     Recent Labs  04/29/15 1221  NA 136  K 4.0  CL 101  GLUCOSE 201*  BUN 13  CALCIUM 9.3  CREATININE 0.95     Results for orders placed or performed during the hospital encounter of 04/29/15 (from the past 24 hour(s))  Comprehensive metabolic panel     Status: Abnormal   Collection Time: 04/29/15 12:21 PM  Result Value Ref Range   Sodium 136 135 - 145 mmol/L   Potassium 4.0 3.5 - 5.1 mmol/L   Chloride 101 101 - 111 mmol/L   CO2 23 22 - 32 mmol/L   Glucose, Bld 201 (H) 65 - 99 mg/dL   BUN 13 6 - 20 mg/dL   Creatinine, Ser 0.95 0.44 - 1.00 mg/dL   Calcium 9.3 8.9 - 10.3 mg/dL   Total Protein 7.2 6.5 - 8.1 g/dL   Albumin 3.7 3.5 - 5.0 g/dL   AST 20 15 - 41 U/L   ALT 11 (L) 14 - 54 U/L   Alkaline Phosphatase 81 38 - 126 U/L   Total Bilirubin 1.2 0.3 - 1.2 mg/dL   GFR calc non Af Amer >60 >60 mL/min   GFR calc Af Amer >60 >60 mL/min   Anion gap 12 5 - 15  Lipase, blood     Status: Abnormal   Collection Time: 04/29/15 12:21 PM  Result Value Ref Range   Lipase 15 (L) 22 - 51 U/L  CBC with Differential     Status: Abnormal   Collection Time: 04/29/15 12:21 PM  Result Value Ref Range   WBC 17.1 (H) 4.0 - 10.5 K/uL   RBC 5.56 (H) 3.87 - 5.11 MIL/uL   Hemoglobin 16.4 (H) 12.0 - 15.0 g/dL   HCT 47.1 (H) 36.0 - 46.0 %   MCV 84.7 78.0 - 100.0 fL   MCH 29.5 26.0 - 34.0 pg   MCHC 34.8 30.0 - 36.0 g/dL   RDW 12.9 11.5 - 15.5 %   Platelets 296 150 - 400 K/uL   Neutrophils Relative % 83 (H) 43 - 77 %   Neutro Abs 14.1 (H) 1.7 - 7.7 K/uL   Lymphocytes Relative 10 (L) 12 - 46 %   Lymphs Abs 1.8 0.7 - 4.0 K/uL   Monocytes Relative 6 3 - 12 %   Monocytes Absolute 1.1 (H) 0.1 - 1.0 K/uL   Eosinophils Relative 1 0 - 5 %  Eosinophils Absolute 0.1 0.0 - 0.7 K/uL   Basophils Relative 0 0 - 1 %   Basophils Absolute 0.0 0.0 - 0.1 K/uL   Urinalysis, Routine w reflex microscopic     Status: Abnormal   Collection Time: 04/29/15 12:28 PM  Result Value Ref Range   Color, Urine YELLOW YELLOW   APPearance CLOUDY (A) CLEAR   Specific Gravity, Urine 1.025 1.005 - 1.030   pH 5.0 5.0 - 8.0   Glucose, UA >1000 (A) NEGATIVE mg/dL   Hgb urine dipstick LARGE (A) NEGATIVE   Bilirubin Urine NEGATIVE NEGATIVE   Ketones, ur 15 (A) NEGATIVE mg/dL   Protein, ur NEGATIVE NEGATIVE mg/dL   Urobilinogen, UA 0.2 0.0 - 1.0 mg/dL   Nitrite NEGATIVE NEGATIVE   Leukocytes, UA NEGATIVE NEGATIVE  Urine microscopic-add on     Status: Abnormal   Collection Time: 04/29/15 12:28 PM  Result Value Ref Range   Squamous Epithelial / LPF FEW (A) RARE   WBC, UA 0-2 <3 WBC/hpf   RBC / HPF 11-20 <3 RBC/hpf   Bacteria, UA MANY (A) RARE  POC Urine Pregnancy, ED  (If Pre-menopausal female)  not at [facility]     Status: None   Collection Time: 04/29/15 12:43 PM  Result Value Ref Range   Preg Test, Ur NEGATIVE NEGATIVE  Urinalysis, Routine w reflex microscopic     Status: Abnormal   Collection Time: 04/29/15  2:48 PM  Result Value Ref Range   Color, Urine YELLOW YELLOW   APPearance CLOUDY (A) CLEAR   Specific Gravity, Urine 1.034 (H) 1.005 - 1.030   pH 5.0 5.0 - 8.0   Glucose, UA >1000 (A) NEGATIVE mg/dL   Hgb urine dipstick LARGE (A) NEGATIVE   Bilirubin Urine NEGATIVE NEGATIVE   Ketones, ur 15 (A) NEGATIVE mg/dL   Protein, ur NEGATIVE NEGATIVE mg/dL   Urobilinogen, UA 0.2 0.0 - 1.0 mg/dL   Nitrite POSITIVE (A) NEGATIVE   Leukocytes, UA NEGATIVE NEGATIVE  Urine microscopic-add on     Status: Abnormal   Collection Time: 04/29/15  2:48 PM  Result Value Ref Range   Squamous Epithelial / LPF RARE RARE   WBC, UA 3-6 <3 WBC/hpf   RBC / HPF 7-10 <3 RBC/hpf   Bacteria, UA MANY (A) RARE   Urine-Other AMORPHOUS URATES/PHOSPHATES    Recent Results (from the past 240 hour(s))  Surgical pcr screen     Status: None   Collection Time: 04/23/15  3:31 PM  Result  Value Ref Range Status   MRSA, PCR NEGATIVE NEGATIVE Final   Staphylococcus aureus NEGATIVE NEGATIVE Final    Comment:        The Xpert SA Assay (FDA approved for NASAL specimens in patients over 18 years of age), is one component of a comprehensive surveillance program.  Test performance has been validated by [facility] for patients greater than or equal to 70 year old. It is not intended to diagnose infection nor to guide or monitor treatment.     Renal Function:  Recent Labs  04/23/15 1517 04/29/15 1221  CREATININE 0.80 0.95   Estimated Creatinine Clearance: 84.5 mL/min (by C-G formula based on Cr of 0.95).  Radiologic Imaging: Ct Abdomen Pelvis Wo Contrast  04/29/2015   CLINICAL DATA:  Right flank pain since yesterday. Nausea, vomiting, diarrhea.  EXAM: CT ABDOMEN AND PELVIS WITHOUT CONTRAST  TECHNIQUE: Multidetector CT imaging of the abdomen and pelvis was performed following the standard protocol without IV contrast.  COMPARISON:  None.  FINDINGS: Lower chest: Lung bases are clear. No effusions. Heart is normal size.  Hepatobiliary: No biliary ductal dilatation or focal hepatic lesion. Gallbladder grossly unremarkable.  Pancreas: No focal abnormality or ductal dilatation.  Spleen: No focal abnormality.  Normal size.  Adrenals/Urinary Tract: Adrenal glands are unremarkable. Left kidney normal. No hydronephrosis. There is mild right hydronephrosis and moderate perinephric stranding. 6-7 mm proximal right ureteral stone present. No additional ureteral stones. Urinary bladder is unremarkable.  Stomach/Bowel: Stomach, large and small bowel grossly unremarkable. Appendix is visualized and unremarkable.  Vascular/Lymphatic: No retroperitoneal or mesenteric adenopathy. Aorta normal caliber.  Reproductive: IUD present within the uterus. Small follicle within the left ovary. No adnexal masses.  Other: No free fluid or free air. Small to moderate-sized umbilical hernia containing fat.   Musculoskeletal: No focal bone lesion or acute bony abnormality.  IMPRESSION: 6-7 mm proximal right ureteral stone with hydronephrosis and perinephric stranding.  Umbilical hernia containing fat.   Electronically Signed   By: Rolm Baptise M.D.   On: 04/29/2015 14:15    Impression/Assessment/Plan:  54 yo with history of DM and HTN who presents with right sided obstructing UPJ stone with concern for concomitant infection. Showing signs of SIRS in ER.   - Posted for emergent cystoscopy and right sided ureteral stent placement  - Admit to Urology  - Broad spectrum antibiotics  - IV fluids  - regular diet following procedure  - Follow cultures  - She was scheduled for knee replacement tomorrow which will need to be postponed       I have seen and examined the patient and agree with the above assesment and plan.  S: Several days malaise, rt flank pain and CT with nearly 25mm right prox stone with hydro and bacteruria, tachycardica, leukocytisis c/w impending urosepsis. No additional stones. Type 2 diabetic.   O: NAD Regular Tachycardia Mild Rt CVAT  A/P: 1 - Emergent right ureteral stent tonight. Risks, benefits, alternatives discussed. Admit post-op with plan for IV ABX and likely DC when afebrile x 24 hours, will need definitive stone management in elective setting after clears all infectious parameters.

## 2015-04-29 NOTE — Anesthesia Procedure Notes (Signed)
Procedure Name: Intubation Date/Time: 04/29/2015 6:37 PM Performed by: Lajuana Carry E Pre-anesthesia Checklist: Patient identified, Emergency Drugs available, Suction available and Patient being monitored Patient Re-evaluated:Patient Re-evaluated prior to inductionOxygen Delivery Method: Circle System Utilized Preoxygenation: Pre-oxygenation with 100% oxygen Intubation Type: IV induction Ventilation: Mask ventilation without difficulty Laryngoscope Size: Mac and 4 Grade View: Grade II Tube type: Oral Tube size: 7.0 mm Number of attempts: 1 Airway Equipment and Method: Stylet and Oral airway Placement Confirmation: ETT inserted through vocal cords under direct vision,  positive ETCO2 and breath sounds checked- equal and bilateral Secured at: 22 cm Tube secured with: Tape Dental Injury: Teeth and Oropharynx as per pre-operative assessment  Comments: Intubated atraumatically by L. Armistead, CRNA

## 2015-04-29 NOTE — ED Provider Notes (Signed)
CSN: [id number]     Arrival date & time 04/29/15  1142 History   First MD Initiated Contact with Patient 04/29/15 1157     Chief Complaint  Patient presents with  . Abdominal Pain     (Consider location/radiation/quality/duration/timing/severity/associated sxs/prior Treatment) HPI Comments: Patient is a 54 year old female with a past medical history of diabetes, hypertension, and PCOS who presents with flank pain that started this morning. The pain is located in her right flank and radiates around to her right abdomen. The pain is described as sharp and severe. The pain started gradually and progressively worsened since the onset. No alleviating/aggravating factors. The patient has tried nothing for symptoms without relief. Associated symptoms include chills, nausea, and vomiting. Patient denies fever, headache, diarrhea, chest pain, SOB, dysuria, constipation, abnormal vaginal bleeding/discharge.      Past Medical History  Diagnosis Date  . Diabetes mellitus   . PCOS (polycystic ovarian syndrome)   . Hypertension   . Osteoarthritis   . Family history of adverse reaction to anesthesia     5 YR SON  HAD HERNIA REPAIR AND GOT NAUSEATED AFTER  . Asthma     SEASONAL  ASTHMA,  COMES ON WITH RESPIRATORY INFECTIONS.  Marland Kitchen Anxiety    Past Surgical History  Procedure Laterality Date  . Cesarean section  2001  . Tonsillectomy     Family History  Problem Relation Age of Onset  . Hypertension Father   . Cancer Father     PROSTATE  . Diabetes Paternal Aunt   . Hypertension Paternal Aunt   . Cancer Paternal Aunt     pancreatic  . Diabetes Paternal Grandmother   . Cancer Paternal Grandmother     leukemia   History  Substance Use Topics  . Smoking status: Current Every Day Smoker -- 0.50 packs/day    Types: Cigarettes    Last Attempt to Quit: 05/11/2012  . Smokeless tobacco: Never Used  . Alcohol Use: Yes     Comment: RARE TO OCCASIONALLY   OB History    Gravida Para Term Preterm  AB TAB SAB Ectopic Multiple Living   1 1  1      1      Review of Systems  Constitutional: Positive for chills. Negative for fever and fatigue.  HENT: Negative for trouble swallowing.   Eyes: Negative for visual disturbance.  Respiratory: Negative for shortness of breath.   Cardiovascular: Negative for chest pain and palpitations.  Gastrointestinal: Positive for nausea, vomiting and abdominal pain. Negative for diarrhea.  Genitourinary: Positive for flank pain. Negative for dysuria and difficulty urinating.  Musculoskeletal: Negative for arthralgias and neck pain.  Skin: Negative for color change.  Neurological: Negative for dizziness and weakness.  Psychiatric/Behavioral: Negative for dysphoric mood.      Allergies  Adhesive  Home Medications   Prior to Admission medications   Medication Sig Start Date End Date Taking? Authorizing Provider  albuterol (PROVENTIL HFA;VENTOLIN HFA) 108 (90 BASE) MCG/ACT inhaler Inhale 2 puffs into the lungs every 6 (six) hours as needed for wheezing or shortness of breath.    Historical Provider, MD  aspirin 81 MG EC tablet Take 1 tablet (81 mg total) by mouth daily. 02/17/13   Leone Haven, MD  atorvastatin (LIPITOR) 10 MG tablet Take 10 mg by mouth daily.    Historical Provider, MD  Canagliflozin (INVOKANA) 300 MG TABS Take 1 tablet (300 mg total) by mouth daily. 05/12/14   Renato Shin, MD  ciprofloxacin (CIPRO) 250  MG tablet Take 1 tablet (250 mg total) by mouth 2 (two) times daily. Patient not taking: Reported on 04/20/2015 01/17/15   Huel Cote, NP  diclofenac (VOLTAREN) 75 MG EC tablet Take 75 mg by mouth 2 (two) times daily.    Historical Provider, MD  DULoxetine (CYMBALTA) 60 MG capsule Take 120 mg by mouth daily.    Historical Provider, MD  fluconazole (DIFLUCAN) 150 MG tablet Take 1 tablet (150 mg total) by mouth once. Patient not taking: Reported on 04/20/2015 01/11/15   Huel Cote, NP  levonorgestrel (MIRENA) 20 MCG/24HR IUD 1 each  by Intrauterine route once.    Historical Provider, MD  lisinopril-hydrochlorothiazide (PRINZIDE,ZESTORETIC) 20-25 MG per tablet Take 1 tablet by mouth daily.    Historical Provider, MD  metFORMIN (GLUCOPHAGE) 1000 MG tablet Take 1,000 mg by mouth 2 (two) times daily with a meal.    Historical Provider, MD  nystatin-triamcinolone ointment (MYCOLOG) Apply 1 application topically 2 (two) times daily. Patient not taking: Reported on 04/20/2015 01/11/15   Huel Cote, NP  sitaGLIPtin (JANUVIA) 100 MG tablet Take 100 mg by mouth daily.    Historical Provider, MD   BP 173/86 mmHg  Pulse 69  Temp(Src) 98.8 F (37.1 C) (Oral)  Resp 22  Ht 5\' 4"  (1.626 m)  Wt 240 lb (108.863 kg)  BMI 41.18 kg/m2  SpO2 99% Physical Exam  Constitutional: She is oriented to person, place, and time. She appears well-developed and well-nourished. No distress.  HENT:  Head: Normocephalic and atraumatic.  Eyes: Conjunctivae and EOM are normal.  Neck: Normal range of motion.  Cardiovascular: Normal rate and regular rhythm.  Exam reveals no gallop and no friction rub.   No murmur heard. Pulmonary/Chest: Effort normal and breath sounds normal. She has no wheezes. She has no rales. She exhibits no tenderness.  Abdominal: Soft. She exhibits no distension. There is no tenderness. There is no rebound.  Genitourinary:  Right CVA tenderness.   Musculoskeletal: Normal range of motion.  Neurological: She is alert and oriented to person, place, and time. Coordination normal.  Speech is goal-oriented. Moves limbs without ataxia.   Skin: Skin is warm and dry.  Psychiatric: She has a normal mood and affect. Her behavior is normal.  Nursing note and vitals reviewed.   ED Course  Procedures (including critical care time) Labs Review Labs Reviewed  URINALYSIS, ROUTINE W REFLEX MICROSCOPIC - Abnormal; Notable for the following:    APPearance CLOUDY (*)    Glucose, UA >1000 (*)    Hgb urine dipstick LARGE (*)    Ketones, ur  15 (*)    All other components within normal limits  COMPREHENSIVE METABOLIC PANEL - Abnormal; Notable for the following:    Glucose, Bld 201 (*)    ALT 11 (*)    All other components within normal limits  LIPASE, BLOOD - Abnormal; Notable for the following:    Lipase 15 (*)    All other components within normal limits  CBC WITH DIFFERENTIAL/PLATELET - Abnormal; Notable for the following:    WBC 17.1 (*)    RBC 5.56 (*)    Hemoglobin 16.4 (*)    HCT 47.1 (*)    Neutrophils Relative % 83 (*)    Neutro Abs 14.1 (*)    Lymphocytes Relative 10 (*)    Monocytes Absolute 1.1 (*)    All other components within normal limits  URINE MICROSCOPIC-ADD ON - Abnormal; Notable for the following:    Squamous Epithelial /  LPF FEW (*)    Bacteria, UA MANY (*)    All other components within normal limits  URINE CULTURE  URINALYSIS, ROUTINE W REFLEX MICROSCOPIC  POC URINE PREG, ED    Imaging Review Ct Abdomen Pelvis Wo Contrast  04/29/2015   CLINICAL DATA:  Right flank pain since yesterday. Nausea, vomiting, diarrhea.  EXAM: CT ABDOMEN AND PELVIS WITHOUT CONTRAST  TECHNIQUE: Multidetector CT imaging of the abdomen and pelvis was performed following the standard protocol without IV contrast.  COMPARISON:  None.  FINDINGS: Lower chest: Lung bases are clear. No effusions. Heart is normal size.  Hepatobiliary: No biliary ductal dilatation or focal hepatic lesion. Gallbladder grossly unremarkable.  Pancreas: No focal abnormality or ductal dilatation.  Spleen: No focal abnormality.  Normal size.  Adrenals/Urinary Tract: Adrenal glands are unremarkable. Left kidney normal. No hydronephrosis. There is mild right hydronephrosis and moderate perinephric stranding. 6-7 mm proximal right ureteral stone present. No additional ureteral stones. Urinary bladder is unremarkable.  Stomach/Bowel: Stomach, large and small bowel grossly unremarkable. Appendix is visualized and unremarkable.  Vascular/Lymphatic: No  retroperitoneal or mesenteric adenopathy. Aorta normal caliber.  Reproductive: IUD present within the uterus. Small follicle within the left ovary. No adnexal masses.  Other: No free fluid or free air. Small to moderate-sized umbilical hernia containing fat.  Musculoskeletal: No focal bone lesion or acute bony abnormality.  IMPRESSION: 6-7 mm proximal right ureteral stone with hydronephrosis and perinephric stranding.  Umbilical hernia containing fat.   Electronically Signed   By: Rolm Baptise M.D.   On: 04/29/2015 14:15     EKG Interpretation None      MDM   Final diagnoses:  Kidney stone on right side    12:24 PM Labs and urinalysis pending. Vitals stable and patient afebrile.   2:53 PM Labs show elevated WBC and creatinine in normal limited. Urine seems to be contaminated with bacteria and no leukocytes. I spoke with Dr. Baltazar Najjar about the patient who recommends 1L fluids bolus, more pain medication and catheterized urine. I will call him back in 1 hour to talk about the patient's status at that time.   Patient transferred to [facility] for stent placement.   [address] [place], PA-C 04/30/15 [id number]  Malvin Johns, MD 04/30/15 1701

## 2015-04-29 NOTE — Anesthesia Preprocedure Evaluation (Addendum)
Anesthesia Evaluation  Patient identified by MRN, date of birth, ID band Patient awake    Reviewed: Allergy & Precautions, NPO status , Patient's Chart, lab work & pertinent test results  Airway Mallampati: III  TM Distance: >3 FB Neck ROM: Full  Mouth opening: Limited Mouth Opening  Dental  (+) Teeth Intact, Dental Advisory Given   Pulmonary Current Smoker,  breath sounds clear to auscultation        Cardiovascular hypertension, Rhythm:Regular Rate:Tachycardia     Neuro/Psych    GI/Hepatic Some nausea   Endo/Other  diabetesMorbid obesity  Renal/GU      Musculoskeletal  (+) Arthritis -,   Abdominal (+) + obese,   Peds  Hematology   Anesthesia Other Findings   Reproductive/Obstetrics                            Anesthesia Physical Anesthesia Plan  ASA: III and emergent  Anesthesia Plan: General   Post-op Pain Management:    Induction: Intravenous  Airway Management Planned: Oral ETT  Additional Equipment:   Intra-op Plan:   Post-operative Plan: Extubation in OR  Informed Consent: I have reviewed the patients History and Physical, chart, labs and discussed the procedure including the risks, benefits and alternatives for the proposed anesthesia with the patient or authorized representative who has indicated his/her understanding and acceptance.   Dental advisory given  Plan Discussed with: CRNA and Surgeon  Anesthesia Plan Comments:         Anesthesia Quick Evaluation

## 2015-04-29 NOTE — Transfer of Care (Signed)
Immediate Anesthesia Transfer of Care Note  Patient: Lydia [name]  Procedure(s) Performed: Procedure(s): CYSTOSCOPY WITH RETROGRADE PYELOGRAM/URETERAL STENT PLACEMENT (Right)  Patient Location: PACU  Anesthesia Type:General  Level of Consciousness: awake, alert , oriented and patient cooperative  Airway & Oxygen Therapy: Patient Spontanous Breathing and Patient connected to face mask oxygen  Post-op Assessment: Report given to RN, Post -op Vital signs reviewed and stable and Patient moving all extremities  Post vital signs: Reviewed and stable  Last Vitals:  Filed Vitals:   04/29/15 1721  BP: 127/55  Pulse: 121  Temp: 37.3 C  Resp: 20    Complications: No apparent anesthesia complications

## 2015-04-29 NOTE — ED Notes (Signed)
Pt refused to sit during triage vital signs. Pts vital documented during triage were while pt was standing.

## 2015-04-29 NOTE — ED Notes (Signed)
Pt complaining of right sided abdominal and flank pain. Pt reports pain started yesterday afternoon and has gotten progressively worse. Pt reports N/V/D. Pt denies fever, but reports chills. Pt denies having any CP or SOB.

## 2015-04-29 NOTE — Brief Op Note (Signed)
04/29/2015  7:02 PM  PATIENT:  Dalene Carrow  54 y.o. female  PRE-OPERATIVE DIAGNOSIS:  right ureteral stone with infection  POST-OPERATIVE DIAGNOSIS:  right ureteral stone with infection  PROCEDURE:  Procedure(s): CYSTOSCOPY WITH RETROGRADE PYELOGRAM/URETERAL STENT PLACEMENT (Right)  SURGEON:  Surgeon(s) and Role:    * Alexis Frock, MD - Primary  PHYSICIAN ASSISTANT:   ASSISTANTS: 1 - Gypsy Lore MD   ANESTHESIA:   general  EBL:     BLOOD ADMINISTERED:none  DRAINS: foley to straight drain   LOCAL MEDICATIONS USED:  MARCAINE     SPECIMEN:  Source of Specimen:  Rt renal pelvis urine for gram stain and culture  DISPOSITION OF SPECIMEN:  PATHOLOGY  COUNTS:  YES  TOURNIQUET:  * No tourniquets in log *  DICTATION: .Other Dictation: Dictation Number [id number]  PLAN OF CARE: Admit to inpatient   PATIENT DISPOSITION:  PACU - hemodynamically stable.   Delay start of Pharmacological VTE agent (>24hrs) due to surgical blood loss or risk of bleeding: not applicable

## 2015-04-30 ENCOUNTER — Inpatient Hospital Stay (HOSPITAL_COMMUNITY): Payer: BC Managed Care – PPO

## 2015-04-30 ENCOUNTER — Encounter (HOSPITAL_COMMUNITY)
Admission: EM | Disposition: A | Discharge status: Home / Self Care | Payer: Self-pay | Source: Home / Self Care | Attending: Urology

## 2015-04-30 ENCOUNTER — Inpatient Hospital Stay (HOSPITAL_COMMUNITY)
Admission: RE | Admit: 2015-04-30 | Payer: BC Managed Care – PPO | Source: Ambulatory Visit | Admitting: Orthopedic Surgery

## 2015-04-30 ENCOUNTER — Encounter (HOSPITAL_COMMUNITY): Payer: Self-pay | Admitting: Urology

## 2015-04-30 LAB — CBC
HCT: 42.5 % (ref 36.0–46.0)
Hemoglobin: 14.3 g/dL (ref 12.0–15.0)
MCH: 29.2 pg (ref 26.0–34.0)
MCHC: 33.6 g/dL (ref 30.0–36.0)
MCV: 86.9 fL (ref 78.0–100.0)
Platelets: 237 10*3/uL (ref 150–400)
RBC: 4.89 MIL/uL (ref 3.87–5.11)
RDW: 13.3 % (ref 11.5–15.5)
WBC: 22.5 10*3/uL — ABNORMAL HIGH (ref 4.0–10.5)

## 2015-04-30 LAB — BASIC METABOLIC PANEL
ANION GAP: 10 (ref 5–15)
BUN: 14 mg/dL (ref 6–20)
CO2: 24 mmol/L (ref 22–32)
Calcium: 8.6 mg/dL — ABNORMAL LOW (ref 8.9–10.3)
Chloride: 105 mmol/L (ref 101–111)
Creatinine, Ser: 0.82 mg/dL (ref 0.44–1.00)
GFR calc non Af Amer: >60 mL/min (ref >60)
GLUCOSE: 234 mg/dL — AB (ref 65–99)
Potassium: 3.5 mmol/L (ref 3.5–5.1)
Sodium: 139 mmol/L (ref 135–145)

## 2015-04-30 LAB — GLUCOSE, CAPILLARY
GLUCOSE-CAPILLARY: 155 mg/dL — AB (ref 65–99)
Glucose-Capillary: 272 mg/dL — ABNORMAL HIGH (ref 65–99)
Glucose-Capillary: 167 mg/dL — ABNORMAL HIGH (ref 65–99)
Glucose-Capillary: 200 mg/dL — ABNORMAL HIGH (ref 65–99)
Glucose-Capillary: 172 mg/dL — ABNORMAL HIGH (ref 65–99)

## 2015-04-30 SURGERY — ARTHROPLASTY, KNEE, TOTAL
Anesthesia: Spinal | Laterality: Left

## 2015-04-30 MED ORDER — DULOXETINE HCL 60 MG PO CPEP
60.0000 mg | ORAL_CAPSULE | Freq: Every day | ORAL | Status: DC
  Administered 2015-04-30 – 2015-05-02 (×3): 60 mg via ORAL
  Filled 2015-04-30 (×3): qty 1

## 2015-04-30 NOTE — Op Note (Signed)
NAMEEMILIJA, BOHMAN               ACCOUNT NO.:  [id number]  MEDICAL RECORD NO.:  [id number]  LOCATION:  36                         FACILITY:  [facility]  PHYSICIAN:  Alexis Frock, MD     DATE OF BIRTH:  1961-10-04  DATE OF PROCEDURE: 04/29/2015                              OPERATIVE REPORT  PREOPERATIVE DIAGNOSES: 1. Right ureteral stone. 2. Urinary tract infection. 3. Impending urosepsis.  PROCEDURE: 1. Cystoscopy with right retrograde pyelogram interpretation. 2. Insertion of right ureteral stent 6 x 24 Contour, no tether.  ESTIMATED BLOOD LOSS:  Nil.  COMPLICATIONS:  None.  SPECIMENS:  Right renal pelvis urine for Gram stain and culture.  ASSISTANT:  Gypsy Lore, MD.  FINDINGS: 1. Mild-to-moderate right hydroureteronephrosis. 2. A density in the mid ureter consistent with known stone. 3. Excellent placement of right ureteral stent, proximal in renal     pelvis and distal in urinary bladder.  DRAINS:  Foley catheter straight drain.  INDICATION:  Ms. Weissberg is a pleasant 54 year old lady with history of obesity, type 2 diabetes, who was found on workup of malaise, tachycardia, right flank pain, and bacteriuria to have an obstructing right ureteral stent.  The patient was also low-grade fever.  This is overall concerning for impending obstructing urosepsis and felt that urgent renal decompression was warranted.  Options were discussed with the patient including a nephrostomy tube versus stenting, and she wished to proceed with the latter.  Given the rarity of this case done at [facility], the patient was transferred from the [facility] ER to the [facility] for right stent placement and postop management. Informed consent was obtained and placed in medical record.  PROCEDURE IN DETAIL:  The patient being, Verle Brillhart, verified. Procedure being right ureteral stent placement was confirmed.  Procedure was carried out.  Time-out was performed.   Intravenous antibiotics were administered.  General anesthesia was introduced.  The patient placed into a low lithotomy position.  Sterile field was created, prepping and draping the patient's vagina, introitus, and proximal thighs using iodine x3.  Next, cystourethroscopy was performed using a 23-French rigid cystoscope with 30-degree offset lens.  Inspection of urinary bladder revealed some mild erythema consistent with likely cystitis.  No diverticula, calcifications, papular lesions were noted.  The right ureteral orifice was cannulated with a Sensor wire over which a 6-French open-ended catheter was placed to the level of the renal pelvis. Hydronephrotic drip was obtained via the open-ended catheter.  A specimen of right renal pelvis urine was obtained and set aside for Gram stain and culture.  It was cloudy.  Gentle right retrograde pyelogram was then obtained.  Right retrograde pyelogram demonstrated a single right ureter, single system right kidney.  There was mild-to-moderate hydroureteronephrosis. Again, purposeful non-opacification was performed to prevent any pyelovenous backflow just enough contrast to opacify the renal pelvis. Next, the open-ended catheter was exchanged via the Sensor wire for a new 6 x 24 Contour type stent and good proximal and distal deployment were noted.  The Foley catheter was placed for easier straight draining, 10 mL water in the balloon.  Procedure was then terminated.  The patient tolerated the procedure well and there were  no immediate periprocedural complications.  The patient was taken to postanesthesia care unit in stable condition with plans for hospital admission for continued IV antibiotics and until she clears her infectious parameters.          ______________________________ Alexis Frock, MD     TM/MEDQ  D:  04/29/2015  T:  04/29/2015  Job:  [id number]

## 2015-04-30 NOTE — Progress Notes (Signed)
1 Day Post-Op  Subjective:  1 - Right Ureteral Stone - 49mm rt mid ureteral stone by ER CT 5/22 on eval right flank pain. NO additional stones. Now s/p stentin 5/22.  2 - UTI / Urosepsis - bacteruria, leukocytosis, tachycardia on ER admission 5/22 prompting mergent right ureteral stent placemetn and admission for IV ABX. [place] CX 5/22 and Rr renla pelvis CX 5/22 pending. Now on empiric amp + gent.   Today "Lydia [name]" is w/o complaints. Fever up and down last PM as expected. Heart rate improving. She was scheudled for knee replacement today which will clearly not happen.   Objective: Vital signs in last 24 hours: Temp:  [98.4 F (36.9 C)-101.2 F (38.4 C)] 98.5 F (36.9 C) (05/23 0413) Pulse Rate:  [69-121] 73 (05/23 0413) Resp:  [18-25] 20 (05/23 0413) BP: (97-186)/(55-95) 137/77 mmHg (05/23 0413) SpO2:  [87 %-100 %] 97 % (05/23 0413) Weight:  [108.863 kg (240 lb)] 108.863 kg (240 lb) (05/22 1210)    Intake/Output from previous day: 05/22 0701 - 05/23 0700 In: 3283.3 [P.O.:1500; I.V.:1683.3; IV Piggyback:100] Out: 1800 [Urine:1800] Intake/Output this shift:    General appearance: alert, cooperative and appears stated age Eyes: negative Nose: Nares normal. Septum midline. Mucosa normal. No drainage or sinus tenderness. Throat: lips, mucosa, and tongue normal; teeth and gums normal Neck: supple, symmetrical, trachea midline Back: symmetric, no curvature. ROM normal. No CVA tenderness. Resp: non-labored on room air Cardio: improved tachycardia GI: soft, non-tender; bowel sounds normal; no masses,  no organomegaly and moderate truncal obesity Pelvic: external genitalia normal and foley c/d/i with clear urine Extremities: extremities normal, atraumatic, no cyanosis or edema Pulses: 2+ and symmetric Skin: Skin color, texture, turgor normal. No rashes or lesions Lymph nodes: Cervical, supraclavicular, and axillary nodes normal. Neurologic: Grossly normal  Lab Results:   Recent  Labs  04/29/15 1221 04/30/15 0409  WBC 17.1* 22.5*  HGB 16.4* 14.3  HCT 47.1* 42.5  PLT 296 237   BMET  Recent Labs  04/29/15 1221 04/30/15 0409  NA 136 139  K 4.0 3.5  CL 101 105  CO2 23 24  GLUCOSE 201* 234*  BUN 13 14  CREATININE 0.95 0.82  CALCIUM 9.3 8.6*   PT/INR No results for input(s): LABPROT, INR in the last 72 hours. ABG No results for input(s): PHART, HCO3 in the last 72 hours.  Invalid input(s): PCO2, PO2  Studies/Results: Ct Abdomen Pelvis Wo Contrast  04/29/2015   CLINICAL DATA:  Right flank pain since yesterday. Nausea, vomiting, diarrhea.  EXAM: CT ABDOMEN AND PELVIS WITHOUT CONTRAST  TECHNIQUE: Multidetector CT imaging of the abdomen and pelvis was performed following the standard protocol without IV contrast.  COMPARISON:  None.  FINDINGS: Lower chest: Lung bases are clear. No effusions. Heart is normal size.  Hepatobiliary: No biliary ductal dilatation or focal hepatic lesion. Gallbladder grossly unremarkable.  Pancreas: No focal abnormality or ductal dilatation.  Spleen: No focal abnormality.  Normal size.  Adrenals/Urinary Tract: Adrenal glands are unremarkable. Left kidney normal. No hydronephrosis. There is mild right hydronephrosis and moderate perinephric stranding. 6-7 mm proximal right ureteral stone present. No additional ureteral stones. Urinary bladder is unremarkable.  Stomach/Bowel: Stomach, large and small bowel grossly unremarkable. Appendix is visualized and unremarkable.  Vascular/Lymphatic: No retroperitoneal or mesenteric adenopathy. Aorta normal caliber.  Reproductive: IUD present within the uterus. Small follicle within the left ovary. No adnexal masses.  Other: No free fluid or free air. Small to moderate-sized umbilical hernia containing fat.  Musculoskeletal: No  focal bone lesion or acute bony abnormality.  IMPRESSION: 6-7 mm proximal right ureteral stone with hydronephrosis and perinephric stranding.  Umbilical hernia containing fat.    Electronically Signed   By: Rolm Baptise M.D.   On: 04/29/2015 14:15    Anti-infectives: Anti-infectives    Start     Dose/Rate Route Frequency Ordered Stop   04/30/15 1800  gentamicin (GARAMYCIN) 380 mg in dextrose 5 % 100 mL IVPB     380 mg 109.5 mL/hr over 60 Minutes Intravenous Every 24 hours 04/29/15 2041     04/30/15 0600  gentamicin (GARAMYCIN) 380 mg in dextrose 5 % 100 mL IVPB  Status:  Discontinued     5 mg/kg  76.4 kg (Adjusted) 109.5 mL/hr over 60 Minutes Intravenous 30 min pre-op 04/29/15 1810 04/29/15 1825   04/30/15 0000  ampicillin (OMNIPEN) 1 g in sodium chloride 0.9 % 50 mL IVPB     1 g 150 mL/hr over 20 Minutes Intravenous Every 6 hours 04/29/15 2024     04/29/15 1845  gentamicin (GARAMYCIN) 380 mg in dextrose 5 % 100 mL IVPB  Status:  Discontinued     5 mg/kg  76.4 kg (Adjusted) 109.5 mL/hr over 60 Minutes Intravenous  Once 04/29/15 1825 04/29/15 2011   04/29/15 1810  ampicillin (OMNIPEN) 1 g in sodium chloride 0.9 % 50 mL IVPB  Status:  Discontinued     1 g 150 mL/hr over 20 Minutes Intravenous 30 min pre-op 04/29/15 1810 04/29/15 1819      Assessment/Plan:  1 - Right Ureteral Stone - now s/p stenting. Discussed definitive management with SWL v. URS at later date when clears infectious parameters. KUB today to see if visible by plain film.   2 - UTI / Urosepsis - continue current ABX pending CX's.   3 - Remain in house.  [facility], Lydia [name] 04/30/2015

## 2015-05-01 LAB — GLUCOSE, CAPILLARY
GLUCOSE-CAPILLARY: 168 mg/dL — AB (ref 65–99)
GLUCOSE-CAPILLARY: 148 mg/dL — AB (ref 65–99)
GLUCOSE-CAPILLARY: 154 mg/dL — AB (ref 65–99)
Glucose-Capillary: 180 mg/dL — ABNORMAL HIGH (ref 65–99)

## 2015-05-01 LAB — BASIC METABOLIC PANEL
Anion gap: 10 (ref 5–15)
BUN: 19 mg/dL (ref 6–20)
CHLORIDE: 102 mmol/L (ref 101–111)
CO2: 24 mmol/L (ref 22–32)
CREATININE: 0.65 mg/dL (ref 0.44–1.00)
Calcium: 8.7 mg/dL — ABNORMAL LOW (ref 8.9–10.3)
GFR calc non Af Amer: >60 mL/min (ref >60)
Glucose, Bld: 182 mg/dL — ABNORMAL HIGH (ref 65–99)
Potassium: 3.4 mmol/L — ABNORMAL LOW (ref 3.5–5.1)
Sodium: 136 mmol/L (ref 135–145)

## 2015-05-01 LAB — CBC
HCT: 40.7 % (ref 36.0–46.0)
Hemoglobin: 13.6 g/dL (ref 12.0–15.0)
MCH: 28.9 pg (ref 26.0–34.0)
MCHC: 33.4 g/dL (ref 30.0–36.0)
MCV: 86.4 fL (ref 78.0–100.0)
Platelets: 234 10*3/uL (ref 150–400)
RBC: 4.71 MIL/uL (ref 3.87–5.11)
RDW: 13.2 % (ref 11.5–15.5)
WBC: 15.7 10*3/uL — ABNORMAL HIGH (ref 4.0–10.5)

## 2015-05-01 LAB — URINE CULTURE
Colony Count: 30000
SPECIAL REQUESTS: NORMAL

## 2015-05-01 MED ORDER — ACETAMINOPHEN 500 MG PO TABS
1000.0000 mg | ORAL_TABLET | Freq: Four times a day (QID) | ORAL | Status: DC | PRN
  Administered 2015-05-01: 1000 mg via ORAL
  Filled 2015-05-01: qty 2

## 2015-05-01 MED ORDER — DICLOFENAC SODIUM 75 MG PO TBEC
75.0000 mg | DELAYED_RELEASE_TABLET | Freq: Two times a day (BID) | ORAL | Status: DC
  Administered 2015-05-01 – 2015-05-02 (×2): 75 mg via ORAL
  Filled 2015-05-01 (×3): qty 1

## 2015-05-01 NOTE — Progress Notes (Signed)
2 Days Post-Op  Subjective:  1 - Right Ureteral Stone - 90mm rt mid ureteral stone by ER CT 5/22 on eval right flank pain. NO additional stones. Now s/p stentin 5/22.  2 - UTI / Urosepsis / Sepsis due to UTI - bacteruria, leukocytosis, tachycardia on ER admission 5/22 prompting emergent right ureteral stent placemetn and admission for IV ABX. Bladdeer CX 5/22 and Rr renla pelvis CX 5/22 pending (GNR's). Now on empiric amp + gent. [place] obtained 5/24.   Today "Lydia [name]" is  Febrile again and with significant malaise. Fevers up and down. CX's still pending. WBC's improved this AM.   Objective: Vital signs in last 24 hours: Temp:  [99 F (37.2 C)-102.2 F (39 C)] 102.2 F (39 C) (05/24 1500) Pulse Rate:  [75-95] 95 (05/24 1420) Resp:  [18-20] 18 (05/24 1003) BP: (132-166)/(76-87) 166/80 mmHg (05/24 1420) SpO2:  [93 %-100 %] 93 % (05/24 1420) Last BM Date: 04/30/15  Intake/Output from previous day: 05/23 0701 - 05/24 0700 In: 1838.5 [P.O.:4; I.V.:1625; IV Piggyback:209.5] Out: 650 [Urine:650] Intake/Output this shift: Total I/O In: 506.3 [P.O.:300; I.V.:156.3; IV Piggyback:50] Out: -   General appearance: alert, cooperative and appears stated age Head: Normocephalic, without obvious abnormality, atraumatic Nose: Nares normal. Septum midline. Mucosa normal. No drainage or sinus tenderness. Throat: lips, mucosa, and tongue normal; teeth and gums normal Neck: supple, symmetrical, trachea midline Back: symmetric, no curvature. ROM normal. No CVA tenderness. Resp: non-labored on room air Cardio: mild regular tachycardia GI: soft, non-tender; bowel sounds normal; no masses,  no organomegaly Extremities: extremities normal, atraumatic, no cyanosis or edema Lymph nodes: Cervical, supraclavicular, and axillary nodes normal. Neurologic: Grossly normal  Lab Results:   Recent Labs  04/30/15 0409 05/01/15 0518  WBC 22.5* 15.7*  HGB 14.3 13.6  HCT 42.5 40.7  PLT 237 234   BMET  Recent  Labs  04/30/15 0409 05/01/15 0518  NA 139 136  K 3.5 3.4*  CL 105 102  CO2 24 24  GLUCOSE 234* 182*  BUN 14 19  CREATININE 0.82 0.65  CALCIUM 8.6* 8.7*   PT/INR No results for input(s): LABPROT, INR in the last 72 hours. ABG No results for input(s): PHART, HCO3 in the last 72 hours.  Invalid input(s): PCO2, PO2  Studies/Results: Dg Abd 1 View  04/30/2015   CLINICAL DATA:  Stent placement, right renal stone  EXAM: ABDOMEN - 1 VIEW  COMPARISON:  04/29/2015 CT  FINDINGS: Right nephro ureteral stent in place. 6 mm oval radiopacity projects over the right upper renal pole possibly representing refluxed previously seen right ureteral calculus. No other radiopaque renal or ureteral calculus is identified. IUD in place. Normal bowel gas pattern. No acute osseous abnormality.  IMPRESSION: 6 mm probable right upper renal pole stone. Alternatively, this could represent bowel content.   Electronically Signed   By: Conchita Paris M.D.   On: 04/30/2015 09:26    Anti-infectives: Anti-infectives    Start     Dose/Rate Route Frequency Ordered Stop   04/30/15 1800  gentamicin (GARAMYCIN) 380 mg in dextrose 5 % 100 mL IVPB     380 mg 109.5 mL/hr over 60 Minutes Intravenous Every 24 hours 04/29/15 2041     04/30/15 0600  gentamicin (GARAMYCIN) 380 mg in dextrose 5 % 100 mL IVPB  Status:  Discontinued     5 mg/kg  76.4 kg (Adjusted) 109.5 mL/hr over 60 Minutes Intravenous 30 min pre-op 04/29/15 1810 04/29/15 1825   04/30/15 0000  ampicillin (OMNIPEN) 1  g in sodium chloride 0.9 % 50 mL IVPB     1 g 150 mL/hr over 20 Minutes Intravenous Every 6 hours 04/29/15 2024     04/29/15 1845  gentamicin (GARAMYCIN) 380 mg in dextrose 5 % 100 mL IVPB  Status:  Discontinued     5 mg/kg  76.4 kg (Adjusted) 109.5 mL/hr over 60 Minutes Intravenous  Once 04/29/15 1825 04/29/15 2011   04/29/15 1810  ampicillin (OMNIPEN) 1 g in sodium chloride 0.9 % 50 mL IVPB  Status:  Discontinued     1 g 150 mL/hr over 20  Minutes Intravenous 30 min pre-op 04/29/15 1810 04/29/15 1819      Assessment/Plan:  1 - Right Ureteral Stone - now s/p stenting. Discussed definitive management with SWL v. URS at later date when clears infectious parameters.  2 - UTI / Urosepsis / Sepsis due to UTI - continue current ABX pending CX's. [place] today also given febrile again.   3 - Remain in house.  [facility], Lydia [name] 05/01/2015

## 2015-05-01 NOTE — Care Management Note (Signed)
Case Management Note  Patient Details  Name: Lydia [name] MRN: [id number] Date of Birth: [birth year]  Subjective/Objective:   54 y/o f admitted w/Nephrolithiasis.From home.                Action/Plan:d/c home.   Expected Discharge Date:                  Expected Discharge Plan:  Home/Self Care  In-House Referral:     Discharge planning Services  CM Consult  Post Acute Care Choice:    Choice offered to:     DME Arranged:    DME Agency:     HH Arranged:    HH Agency:     Status of Service:  In process, will continue to follow  Medicare Important Message Given:    Date Medicare IM Given:    Medicare IM give by:    Date Additional Medicare IM Given:    Additional Medicare Important Message give by:     If discussed at [place] of Stay Meetings, dates discussed:    Additional Comments:  Dessa Phi, RN 05/01/2015, 3:24 PM

## 2015-05-01 NOTE — Clinical Documentation Improvement (Signed)
MD's, NP's, and PA's  Per coding guidelines "Urosepsis" codes to "UTI". Please clarify what diagnosis is meant when "Urosepsis" is documented,  can  it be further specified as one of the diagnoses listed below? If appropriate for this admission please document diagnosis  in p/nts or d/c summary. Thank you   Possible Conditions? . Sepsis due to UTI . UTI . Unable to determine . Unknown . Other Condition . Cannot Clinically Determine   Thank You, Ree Kida ,RN Clinical Documentation Specialist:  [phone]  [place] Information Management

## 2015-05-02 LAB — CBC WITH DIFFERENTIAL/PLATELET
BASOS PCT: 0 % (ref 0–1)
Basophils Absolute: 0 10*3/uL (ref 0.0–0.1)
EOS ABS: 0 10*3/uL (ref 0.0–0.7)
Eosinophils Relative: 0 % (ref 0–5)
HEMATOCRIT: 42.1 % (ref 36.0–46.0)
HEMOGLOBIN: 14 g/dL (ref 12.0–15.0)
LYMPHS ABS: 1.5 10*3/uL (ref 0.7–4.0)
Lymphocytes Relative: 24 % (ref 12–46)
MCH: 28.7 pg (ref 26.0–34.0)
MCHC: 33.3 g/dL (ref 30.0–36.0)
MCV: 86.4 fL (ref 78.0–100.0)
MONOS PCT: 8 % (ref 3–12)
Monocytes Absolute: 0.5 10*3/uL (ref 0.1–1.0)
NEUTROS ABS: 4.1 10*3/uL (ref 1.7–7.7)
NEUTROS PCT: 68 % (ref 43–77)
PLATELETS: 205 10*3/uL (ref 150–400)
RBC: 4.87 MIL/uL (ref 3.87–5.11)
RDW: 13.1 % (ref 11.5–15.5)
WBC: 6.1 10*3/uL (ref 4.0–10.5)

## 2015-05-02 LAB — GLUCOSE, CAPILLARY
GLUCOSE-CAPILLARY: 192 mg/dL — AB (ref 65–99)
Glucose-Capillary: 150 mg/dL — ABNORMAL HIGH (ref 65–99)
Glucose-Capillary: 170 mg/dL — ABNORMAL HIGH (ref 65–99)

## 2015-05-02 LAB — BASIC METABOLIC PANEL
ANION GAP: 12 (ref 5–15)
BUN: 16 mg/dL (ref 6–20)
CALCIUM: 8.8 mg/dL — AB (ref 8.9–10.3)
CHLORIDE: 94 mmol/L — AB (ref 101–111)
CO2: 30 mmol/L (ref 22–32)
Creatinine, Ser: 0.86 mg/dL (ref 0.44–1.00)
GFR calc Af Amer: >60 mL/min (ref >60)
GFR calc non Af Amer: >60 mL/min (ref >60)
Glucose, Bld: 178 mg/dL — ABNORMAL HIGH (ref 65–99)
POTASSIUM: 3 mmol/L — AB (ref 3.5–5.1)
Sodium: 136 mmol/L (ref 135–145)

## 2015-05-02 LAB — GENTAMICIN LEVEL, RANDOM: Gentamicin Rm: 2 ug/mL

## 2015-05-02 LAB — URINE CULTURE: Colony Count: >=100000

## 2015-05-02 MED ORDER — LEVOFLOXACIN 750 MG PO TABS: 750.0000 mg | ORAL_TABLET | Freq: Every day | ORAL | Status: DC

## 2015-05-02 MED ORDER — SENNOSIDES-DOCUSATE SODIUM 8.6-50 MG PO TABS: 1.0000 | ORAL_TABLET | Freq: Two times a day (BID) | ORAL | Status: DC

## 2015-05-02 MED ORDER — LEVOFLOXACIN 500 MG PO TABS
500.0000 mg | ORAL_TABLET | Freq: Every day | ORAL | Status: DC
  Administered 2015-05-02: 500 mg via ORAL
  Filled 2015-05-02: qty 1

## 2015-05-02 MED ORDER — OXYCODONE-ACETAMINOPHEN 5-325 MG PO TABS: 1.0000 | ORAL_TABLET | Freq: Four times a day (QID) | ORAL | Status: DC | PRN

## 2015-05-02 NOTE — Progress Notes (Signed)
3 Days Post-Op  Subjective:  1 - Right Ureteral Stone - 72mm rt mid ureteral stone by ER CT 5/22 on eval right flank pain. NO additional stones. Now s/p stent on 5/22.  2 - UTI / Urosepsis / Sepsis due to UTI - bacteruria, leukocytosis, tachycardia on ER admission 5/22 prompting emergent right ureteral stent placemetn and admission for IV ABX. Bladdeer CX 5/22 and Rr renla pelvis CX 5/22 growing Klebsiella. Now on empiric amp + gent. [place] obtained 5/24 and still pending.    Today, Lydia [name] is feeling better.  She had another high fever yesterday and blood cultures were sent.  Afebrile since then.  Having some loose bowel movements.  WBC downtrending and WNL today (6.1).  Objective: Vital signs in last 24 hours: Temp:  [98.4 F (36.9 C)-102.2 F (39 C)] 98.4 F (36.9 C) (05/25 0545) Pulse Rate:  [73-95] 73 (05/25 0545) Resp:  [16-20] 16 (05/25 0545) BP: (108-166)/(54-87) 108/64 mmHg (05/25 0545) SpO2:  [93 %-100 %] 100 % (05/25 0545) Last BM Date: 04/30/15  Intake/Output from previous day: 05/24 0701 - 05/25 0700 In: 1205.8 [P.O.:740; I.V.:156.3; IV Piggyback:309.5] Out: -  Intake/Output this shift:    General appearance: alert, cooperative and appears stated age Head: Normocephalic, without obvious abnormality, atraumatic Nose: Nares normal. Septum midline. Mucosa normal. No drainage or sinus tenderness. Throat: lips, mucosa, and tongue normal; teeth and gums normal Neck: supple, symmetrical, trachea midline Back: symmetric, no curvature. ROM normal. No CVA tenderness. Resp: non-labored on room air Cardio: mild regular tachycardia GI: soft, non-tender; bowel sounds normal; no masses,  no organomegaly Extremities: extremities normal, atraumatic, no cyanosis or edema Lymph nodes: Cervical, supraclavicular, and axillary nodes normal. Neurologic: Grossly normal  Lab Results:   Recent Labs  05/01/15 0518 05/02/15 0540  WBC 15.7* 6.1  HGB 13.6 14.0  HCT 40.7 42.1  PLT 234 205    BMET  Recent Labs  05/01/15 0518 05/02/15 0540  NA 136 136  K 3.4* 3.0*  CL 102 94*  CO2 24 30  GLUCOSE 182* 178*  BUN 19 16  CREATININE 0.65 0.86  CALCIUM 8.7* 8.8*   PT/INR No results for input(s): LABPROT, INR in the last 72 hours. ABG No results for input(s): PHART, HCO3 in the last 72 hours.  Invalid input(s): PCO2, PO2  Studies/Results: Dg Abd 1 View  04/30/2015   CLINICAL DATA:  Stent placement, right renal stone  EXAM: ABDOMEN - 1 VIEW  COMPARISON:  04/29/2015 CT  FINDINGS: Right nephro ureteral stent in place. 6 mm oval radiopacity projects over the right upper renal pole possibly representing refluxed previously seen right ureteral calculus. No other radiopaque renal or ureteral calculus is identified. IUD in place. Normal bowel gas pattern. No acute osseous abnormality.  IMPRESSION: 6 mm probable right upper renal pole stone. Alternatively, this could represent bowel content.   Electronically Signed   By: Conchita Paris M.D.   On: 04/30/2015 09:26    Anti-infectives: Anti-infectives    Start     Dose/Rate Route Frequency Ordered Stop   04/30/15 1800  gentamicin (GARAMYCIN) 380 mg in dextrose 5 % 100 mL IVPB     380 mg 109.5 mL/hr over 60 Minutes Intravenous Every 24 hours 04/29/15 2041     04/30/15 0600  gentamicin (GARAMYCIN) 380 mg in dextrose 5 % 100 mL IVPB  Status:  Discontinued     5 mg/kg  76.4 kg (Adjusted) 109.5 mL/hr over 60 Minutes Intravenous 30 min pre-op 04/29/15 1810 04/29/15 1825  04/30/15 0000  ampicillin (OMNIPEN) 1 g in sodium chloride 0.9 % 50 mL IVPB     1 g 150 mL/hr over 20 Minutes Intravenous Every 6 hours 04/29/15 2024     04/29/15 1845  gentamicin (GARAMYCIN) 380 mg in dextrose 5 % 100 mL IVPB  Status:  Discontinued     5 mg/kg  76.4 kg (Adjusted) 109.5 mL/hr over 60 Minutes Intravenous  Once 04/29/15 1825 04/29/15 2011   04/29/15 1810  ampicillin (OMNIPEN) 1 g in sodium chloride 0.9 % 50 mL IVPB  Status:  Discontinued     1  g 150 mL/hr over 20 Minutes Intravenous 30 min pre-op 04/29/15 1810 04/29/15 1819      Assessment/Plan:  1 - Right Ureteral Stone - now s/p stenting. Discussed definitive management with SWL v. URS at later date when clears infectious parameters.  2 - UTI / Urosepsis / Sepsis due to UTI - Switch to oral Levaquin.  Urine cultures with Klebsiella, sensitivities in chart.  Will monitor today, follow blood cultures and discharge today vs. Tomorrow.  Baltazar Najjar, Will 05/02/2015  I have seen and examined the pateint and agree with assesment and plan as outlined above as well as separate DC summary dated today.

## 2015-05-02 NOTE — Discharge Summary (Signed)
Physician Discharge Summary  Patient ID: Lydia [name] MRN: [id number] DOB/AGE: July 15, 1961 54 y.o.  Admit date: 04/29/2015 Discharge date: 05/02/2015  Admission Diagnoses: right ureteral stone, Sepsis due to UTI  Discharge Diagnoses:  Active Problems:   Nephrolithiasis Sepsis due to UTI  Discharged Condition: fair  Hospital Course:   1 - Right Ureteral Stone - 62mm rt mid ureteral stone by ER CT 5/22 on eval right flank pain. NO additional stones. Now s/p stent on 5/22.  2 - UTI / Urosepsis / Sepsis due to UTI - bacteruria, leukocytosis, tachycardia on ER admission 5/22 prompting emergent right ureteral stent placemetn and admission for IV ABX. Bladdeer CX 5/22 and Rr renla pelvis CX 5/22 growing Klebsiella sensitive to gent (which she has been on ) and levaquin (will be sent home on, tollerated first dose on day of discharge).  By 5/25, the day of discharge, the patient has had no high grade fevers x 24 hours, pain controlled, maintainin oral hydration, on CX-specific oral antibiotics, and felt to be adequate for discharge.   Consults: None  Significant Diagnostic Studies: labs: as per above  Treatments: antibiotics: gent, ampicillin, levaquin and surgery:  right ureteral stent placement 04/29/15  Discharge Exam: Blood pressure 129/67, pulse 69, temperature 99.4 F (37.4 C), temperature source Oral, resp. rate 16, height 5\' 4"  (1.626 m), weight 108.863 kg (240 lb), SpO2 98 %. General appearance: alert, cooperative, appears stated age and familiy at bedside, much more energetic, less malaise Eyes: negative Throat: lips, mucosa, and tongue normal; teeth and gums normal Back: symmetric, no curvature. ROM normal. No CVA tenderness. Resp: non-labored on room air Cardio: Nl rate GI: soft, non-tender; bowel sounds normal; no masses,  no organomegaly Extremities: extremities normal, atraumatic, no cyanosis or edema Pulses: 2+ and symmetric Skin: Skin color, texture, turgor normal.  No rashes or lesions Lymph nodes: Cervical, supraclavicular, and axillary nodes normal. Neurologic: Grossly normal  Disposition: 01-Home or Self Care     Medication List    TAKE these medications        acetaminophen 325 MG tablet  Commonly known as:  TYLENOL  Take 650 mg by mouth every 6 (six) hours as needed (pain).     albuterol 108 (90 BASE) MCG/ACT inhaler  Commonly known as:  PROVENTIL HFA;VENTOLIN HFA  Inhale 2 puffs into the lungs every 6 (six) hours as needed for wheezing or shortness of breath.     aspirin 81 MG EC tablet  Take 1 tablet (81 mg total) by mouth daily.     atorvastatin 10 MG tablet  Commonly known as:  LIPITOR  Take 10 mg by mouth at bedtime.     canagliflozin 300 MG Tabs tablet  Commonly known as:  INVOKANA  Take 1 tablet (300 mg total) by mouth daily.     diclofenac 75 MG EC tablet  Commonly known as:  VOLTAREN  Take 75 mg by mouth 2 (two) times daily.     DULoxetine 60 MG capsule  Commonly known as:  CYMBALTA  Take 60 mg by mouth 2 (two) times daily.     levofloxacin 750 MG tablet  Commonly known as:  LEVAQUIN  Take 1 tablet (750 mg total) by mouth daily. X 14 days for urine / kidney infection     levonorgestrel 20 MCG/24HR IUD  Commonly known as:  MIRENA  1 each by Intrauterine route once. Implanted fall 2013     lisinopril-hydrochlorothiazide 20-25 MG per tablet  Commonly known as:  PRINZIDE,ZESTORETIC  Take 1 tablet by mouth daily.     metFORMIN 1000 MG tablet  Commonly known as:  GLUCOPHAGE  Take 1,000 mg by mouth 2 (two) times daily with a meal.     oxyCODONE-acetaminophen 5-325 MG per tablet  Commonly known as:  ROXICET  Take 1-2 tablets by mouth every 6 (six) hours as needed for moderate pain or severe pain. Post-operatively     PROBIOTIC PO  Take 1 capsule by mouth at bedtime.     senna-docusate 8.6-50 MG per tablet  Commonly known as:  Senokot-S  Take 1 tablet by mouth 2 (two) times daily. As needed while taking pain  meds to prevent constipation     sitaGLIPtin 100 MG tablet  Commonly known as:  JANUVIA  Take 100 mg by mouth daily.           Follow-up Information    Follow up with Alexis Frock, MD.   Specialty:  Urology   Why:  please call office to arrange appointment   Contact information:   [place] [phone]       Signed: Alexis Frock 05/02/2015, 5:37 PM

## 2015-05-02 NOTE — Discharge Instructions (Signed)
1 - You may have urinary urgency (bladder spasms) and bloody urine on / off with stent in place. This is normal. ° °2 - Call MD or go to ER for fever >102, severe pain / nausea / vomiting not relieved by medications, or acute change in medical status ° °

## 2015-05-04 NOTE — Anesthesia Postprocedure Evaluation (Signed)
  Anesthesia Post-op Note  Patient: Lydia [name]  Procedure(s) Performed: Procedure(s): CYSTOSCOPY WITH RETROGRADE PYELOGRAM/URETERAL STENT PLACEMENT (Right)  Patient Location: PACU  Anesthesia Type:General  Level of Consciousness: awake and alert   Airway and Oxygen Therapy: Patient Spontanous Breathing  Post-op Pain: mild  Post-op Assessment: Post-op Vital signs reviewed  Post-op Vital Signs: stable  Last Vitals:  Filed Vitals:   05/02/15 1500  BP: 129/67  Pulse: 69  Temp: 37.4 C  Resp: 16    Complications: No apparent anesthesia complications

## 2015-05-08 LAB — CULTURE, BLOOD (ROUTINE X 2)
CULTURE: NO GROWTH
CULTURE: NO GROWTH

## 2015-05-25 ENCOUNTER — Other Ambulatory Visit: Payer: Self-pay | Admitting: Urology

## 2015-05-29 ENCOUNTER — Encounter (HOSPITAL_COMMUNITY): Payer: Self-pay | Admitting: *Deleted

## 2015-05-30 ENCOUNTER — Encounter (HOSPITAL_COMMUNITY)
Admission: RE | Disposition: A | Discharge status: Home / Self Care | Payer: Self-pay | Source: Ambulatory Visit | Attending: Urology

## 2015-05-30 ENCOUNTER — Ambulatory Visit (HOSPITAL_COMMUNITY)
Admission: RE | Admit: 2015-05-30 | Discharge: 2015-05-30 | Disposition: A | Discharge status: Home / Self Care | Payer: BC Managed Care – PPO | Source: Ambulatory Visit | Attending: Urology | Admitting: Urology

## 2015-05-30 ENCOUNTER — Ambulatory Visit (HOSPITAL_COMMUNITY): Payer: BC Managed Care – PPO | Admitting: Registered Nurse

## 2015-05-30 ENCOUNTER — Encounter (HOSPITAL_COMMUNITY): Payer: Self-pay | Admitting: *Deleted

## 2015-05-30 DIAGNOSIS — E282 Polycystic ovarian syndrome: Secondary | ICD-10-CM | POA: Diagnosis not present

## 2015-05-30 DIAGNOSIS — N201 Calculus of ureter: Secondary | ICD-10-CM | POA: Insufficient documentation

## 2015-05-30 DIAGNOSIS — F419 Anxiety disorder, unspecified: Secondary | ICD-10-CM | POA: Insufficient documentation

## 2015-05-30 DIAGNOSIS — J45909 Unspecified asthma, uncomplicated: Secondary | ICD-10-CM | POA: Diagnosis not present

## 2015-05-30 DIAGNOSIS — Z87440 Personal history of urinary (tract) infections: Secondary | ICD-10-CM | POA: Diagnosis not present

## 2015-05-30 DIAGNOSIS — E119 Type 2 diabetes mellitus without complications: Secondary | ICD-10-CM | POA: Diagnosis not present

## 2015-05-30 DIAGNOSIS — I10 Essential (primary) hypertension: Secondary | ICD-10-CM | POA: Insufficient documentation

## 2015-05-30 DIAGNOSIS — F17210 Nicotine dependence, cigarettes, uncomplicated: Secondary | ICD-10-CM | POA: Diagnosis not present

## 2015-05-30 DIAGNOSIS — E785 Hyperlipidemia, unspecified: Secondary | ICD-10-CM | POA: Diagnosis not present

## 2015-05-30 DIAGNOSIS — M1990 Unspecified osteoarthritis, unspecified site: Secondary | ICD-10-CM | POA: Insufficient documentation

## 2015-05-30 HISTORY — PX: CYSTOSCOPY WITH RETROGRADE PYELOGRAM, URETEROSCOPY AND STENT PLACEMENT: SHX5789

## 2015-05-30 HISTORY — PX: HOLMIUM LASER APPLICATION: SHX5852

## 2015-05-30 LAB — GLUCOSE, CAPILLARY
Glucose-Capillary: 130 mg/dL — ABNORMAL HIGH (ref 65–99)
Glucose-Capillary: 140 mg/dL — ABNORMAL HIGH (ref 65–99)

## 2015-05-30 SURGERY — CYSTOURETEROSCOPY, WITH RETROGRADE PYELOGRAM AND STENT INSERTION
Anesthesia: General | Laterality: Right

## 2015-05-30 MED ORDER — DEXAMETHASONE SODIUM PHOSPHATE 10 MG/ML IJ SOLN
INTRAMUSCULAR | Status: AC
  Filled 2015-05-30: qty 1

## 2015-05-30 MED ORDER — LIDOCAINE HCL (CARDIAC) 20 MG/ML IV SOLN
INTRAVENOUS | Status: AC
  Filled 2015-05-30: qty 5

## 2015-05-30 MED ORDER — FLUCONAZOLE 100MG IVPB
100.0000 mg | INTRAVENOUS | Status: DC
  Administered 2015-05-30: 100 mg via INTRAVENOUS
  Filled 2015-05-30 (×2): qty 50

## 2015-05-30 MED ORDER — ONDANSETRON HCL 4 MG/2ML IJ SOLN
INTRAMUSCULAR | Status: AC
  Filled 2015-05-30: qty 2

## 2015-05-30 MED ORDER — FENTANYL CITRATE (PF) 250 MCG/5ML IJ SOLN
INTRAMUSCULAR | Status: AC
  Filled 2015-05-30: qty 5

## 2015-05-30 MED ORDER — GENTAMICIN IN SALINE 1.6-0.9 MG/ML-% IV SOLN: 80.0000 mg | INTRAVENOUS | Status: DC

## 2015-05-30 MED ORDER — PROPOFOL 10 MG/ML IV BOLUS
INTRAVENOUS | Status: AC
  Filled 2015-05-30: qty 20

## 2015-05-30 MED ORDER — PROMETHAZINE HCL 25 MG/ML IJ SOLN: 6.2500 mg | INTRAMUSCULAR | Status: DC | PRN

## 2015-05-30 MED ORDER — OXYCODONE-ACETAMINOPHEN 5-325 MG PO TABS: 1.0000 | ORAL_TABLET | Freq: Four times a day (QID) | ORAL | Status: DC | PRN

## 2015-05-30 MED ORDER — LIDOCAINE HCL (CARDIAC) 10 MG/ML IV SOLN
INTRAVENOUS | Status: DC | PRN
  Administered 2015-05-30: 100 mg via INTRAVENOUS

## 2015-05-30 MED ORDER — BELLADONNA ALKALOIDS-OPIUM 16.2-60 MG RE SUPP
RECTAL | Status: AC
  Filled 2015-05-30: qty 1

## 2015-05-30 MED ORDER — MIDAZOLAM HCL 2 MG/2ML IJ SOLN
INTRAMUSCULAR | Status: AC
  Filled 2015-05-30: qty 2

## 2015-05-30 MED ORDER — LACTATED RINGERS IV SOLN: INTRAVENOUS | Status: DC

## 2015-05-30 MED ORDER — MIDAZOLAM HCL 5 MG/5ML IJ SOLN
INTRAMUSCULAR | Status: DC | PRN
  Administered 2015-05-30: 2 mg via INTRAVENOUS

## 2015-05-30 MED ORDER — IOHEXOL 300 MG/ML  SOLN
INTRAMUSCULAR | Status: DC | PRN
Start: 2015-05-30 — End: 2015-05-30
  Administered 2015-05-30: 40 mL via URETHRAL

## 2015-05-30 MED ORDER — LACTATED RINGERS IV SOLN
INTRAVENOUS | Status: DC
  Administered 2015-05-30: 1000 mL via INTRAVENOUS
  Administered 2015-05-30: 16:00:00 via INTRAVENOUS

## 2015-05-30 MED ORDER — SODIUM CHLORIDE 0.9 % IR SOLN
Status: DC | PRN
  Administered 2015-05-30: 4000 mL

## 2015-05-30 MED ORDER — ONDANSETRON HCL 4 MG/2ML IJ SOLN
INTRAMUSCULAR | Status: DC | PRN
Start: 2015-05-30 — End: 2015-05-30
  Administered 2015-05-30: 4 mg via INTRAVENOUS

## 2015-05-30 MED ORDER — LIDOCAINE HCL 2 % EX GEL
CUTANEOUS | Status: AC
  Filled 2015-05-30: qty 10

## 2015-05-30 MED ORDER — SENNOSIDES-DOCUSATE SODIUM 8.6-50 MG PO TABS: 1.0000 | ORAL_TABLET | Freq: Two times a day (BID) | ORAL | Status: DC

## 2015-05-30 MED ORDER — PROPOFOL 10 MG/ML IV BOLUS
INTRAVENOUS | Status: DC | PRN
  Administered 2015-05-30: 200 mg via INTRAVENOUS

## 2015-05-30 MED ORDER — GENTAMICIN SULFATE 40 MG/ML IJ SOLN
5.0000 mg/kg | INTRAVENOUS | Status: AC
  Administered 2015-05-30: 380 mg via INTRAVENOUS
  Filled 2015-05-30: qty 9.5

## 2015-05-30 MED ORDER — FENTANYL CITRATE (PF) 100 MCG/2ML IJ SOLN
INTRAMUSCULAR | Status: DC | PRN
  Administered 2015-05-30 (×5): 50 ug via INTRAVENOUS

## 2015-05-30 MED ORDER — FENTANYL CITRATE (PF) 100 MCG/2ML IJ SOLN: 25.0000 ug | INTRAMUSCULAR | Status: DC | PRN

## 2015-05-30 MED ORDER — MEPERIDINE HCL 50 MG/ML IJ SOLN: 6.2500 mg | INTRAMUSCULAR | Status: DC | PRN

## 2015-05-30 SURGICAL SUPPLY — 23 items
BASKET LASER NITINOL 1.9FR (BASKET) ×3 IMPLANT
BASKET STNLS GEMINI 4WIRE 3FR (BASKET) IMPLANT
BASKET ZERO TIP NITINOL 2.4FR (BASKET) IMPLANT
CATH INTERMIT  6FR 70CM (CATHETERS) ×3 IMPLANT
CLOTH BEACON ORANGE TIMEOUT ST (SAFETY) ×3 IMPLANT
ELECT REM PT RETURN 9FT ADLT (ELECTROSURGICAL)
ELECTRODE REM PT RTRN 9FT ADLT (ELECTROSURGICAL) IMPLANT
FIBER LASER FLEXIVA 1000 (UROLOGICAL SUPPLIES) IMPLANT
FIBER LASER FLEXIVA 200 (UROLOGICAL SUPPLIES) ×3 IMPLANT
FIBER LASER FLEXIVA 365 (UROLOGICAL SUPPLIES) IMPLANT
FIBER LASER FLEXIVA 550 (UROLOGICAL SUPPLIES) IMPLANT
FIBER LASER TRAC TIP (UROLOGICAL SUPPLIES) IMPLANT
GLOVE BIOGEL M STRL SZ7.5 (GLOVE) ×12 IMPLANT
GOWN STRL REUS W/TWL XL LVL3 (GOWN DISPOSABLE) ×9 IMPLANT
GUIDEWIRE ANG ZIPWIRE 038X150 (WIRE) ×3 IMPLANT
GUIDEWIRE STR DUAL SENSOR (WIRE) ×3 IMPLANT
IV NS IRRIG 3000ML ARTHROMATIC (IV SOLUTION) ×3 IMPLANT
PACK CYSTO (CUSTOM PROCEDURE TRAY) ×3 IMPLANT
SHEATH ACCESS URETERAL 24CM (SHEATH) ×3 IMPLANT
STENT CONTOUR 6FRX24X.038 (STENTS) ×3 IMPLANT
SYRINGE 10CC LL (SYRINGE) ×3 IMPLANT
SYRINGE IRR TOOMEY STRL 70CC (SYRINGE) ×3 IMPLANT
TUBE FEEDING 8FR 16IN STR KANG (MISCELLANEOUS) ×3 IMPLANT

## 2015-05-30 NOTE — Discharge Instructions (Signed)
1 - You may have urinary urgency (bladder spasms) and bloody urine on / off with stent in place. This is normal.  2 - Call MD or go to ER for fever >102, severe pain / nausea / vomiting not relieved by medications, or acute change in medical status  3 - Remove tethered stent of Friday morning at home by pulling on string, then blue/white plastic tubing, and discarding. Dr. Tresa Moore is in the office Friday if there are problems with this.

## 2015-05-30 NOTE — Anesthesia Preprocedure Evaluation (Signed)
Anesthesia Evaluation  Patient identified by MRN, date of birth, ID band Patient awake    Reviewed: Allergy & Precautions, NPO status , Patient's Chart, lab work & pertinent test results  Airway Mallampati: III  TM Distance: >3 FB Neck ROM: Full  Mouth opening: Limited Mouth Opening  Dental  (+) Teeth Intact, Dental Advisory Given   Pulmonary asthma , Current Smoker,  breath sounds clear to auscultation        Cardiovascular hypertension, Pt. on medications Rhythm:Regular Rate:Tachycardia     Neuro/Psych negative neurological ROS  negative psych ROS   GI/Hepatic negative GI ROS, Neg liver ROS, Some nausea   Endo/Other  diabetesMorbid obesity  Renal/GU negative Renal ROS  negative genitourinary   Musculoskeletal negative musculoskeletal ROS (+) Arthritis -,   Abdominal (+) + obese,   Peds negative pediatric ROS (+)  Hematology negative hematology ROS (+)   Anesthesia Other Findings   Reproductive/Obstetrics negative OB ROS                             Anesthesia Physical  Anesthesia Plan  ASA: III and emergent  Anesthesia Plan: General   Post-op Pain Management:    Induction: Intravenous  Airway Management Planned: Oral ETT  Additional Equipment:   Intra-op Plan:   Post-operative Plan: Extubation in OR  Informed Consent: I have reviewed the patients History and Physical, chart, labs and discussed the procedure including the risks, benefits and alternatives for the proposed anesthesia with the patient or authorized representative who has indicated his/her understanding and acceptance.   Dental advisory given  Plan Discussed with: CRNA and Surgeon  Anesthesia Plan Comments:         Anesthesia Quick Evaluation

## 2015-05-30 NOTE — Anesthesia Procedure Notes (Signed)
Procedure Name: LMA Insertion Date/Time: 05/30/2015 3:28 PM Performed by: Enrigue Catena E Pre-anesthesia Checklist: Patient identified, Emergency Drugs available, Suction available and Patient being monitored Patient Re-evaluated:Patient Re-evaluated prior to inductionOxygen Delivery Method: Circle System Utilized Preoxygenation: Pre-oxygenation with 100% oxygen Intubation Type: IV induction Ventilation: Mask ventilation without difficulty LMA: LMA inserted LMA Size: 4.0 Tube type: Oral Number of attempts: 1 Placement Confirmation: positive ETCO2 Tube secured with: Tape Dental Injury: Teeth and Oropharynx as per pre-operative assessment

## 2015-05-30 NOTE — H&P (Signed)
Lydia [name] is an 54 y.o. female.    Chief Complaint: Pre-Op Right Ureteroscopic Stone Manipulation  HPI:      1 - Nephrolithiasis - 21mm Rt prox ureteral stone (SSD 14 cm, 1000HU) s/p stenting  04/29/15 at time of UTI. No additional or prior stones. F/u KUB with stone in right lower pole.  2 - Urinary Tract Infection - pt with pyelonephritis 04/2015 from infected / impacted stone. UCX Klebsiella sens bactrim, cipro, keflex, gent. She is obese, diabetic, and on invokana. Most recent f/u UCX with resolution of bacteruria but some funguria noted.   PMH sig for DM2, Obesity, HTN, HLD. NO blood thinners.  Today " Lydia [name]" is seen to proceed with right ureteroscopic stone manipulation. No interval fevers. She has been on diflucan pre-op,.    Past Medical History  Diagnosis Date  . Diabetes mellitus   . PCOS (polycystic ovarian syndrome)   . Hypertension   . Osteoarthritis   . Family history of adverse reaction to anesthesia     5 YR SON  HAD HERNIA REPAIR AND GOT NAUSEATED AFTER  . Asthma     SEASONAL  ASTHMA,  COMES ON WITH RESPIRATORY INFECTIONS.  Marland Kitchen Anxiety     Past Surgical History  Procedure Laterality Date  . Cesarean section  2001  . Tonsillectomy    . Cystoscopy w/ ureteral stent placement Right 04/29/2015    Procedure: CYSTOSCOPY WITH RETROGRADE PYELOGRAM/URETERAL STENT PLACEMENT;  Surgeon: Alexis Frock, MD;  Location: [facility];  Service: Urology;  Laterality: Right;    Family History  Problem Relation Age of Onset  . Hypertension Father   . Cancer Father     PROSTATE  . Diabetes Paternal Aunt   . Hypertension Paternal Aunt   . Cancer Paternal Aunt     pancreatic  . Diabetes Paternal Grandmother   . Cancer Paternal Grandmother     leukemia   Social History:  reports that she has been smoking Cigarettes.  She has been smoking about 0.50 packs per day. She has never used smokeless tobacco. She reports that she drinks alcohol. She reports that she does not use illicit  drugs.  Allergies:  Allergies  Allergen Reactions  . Adhesive [Tape] Rash    No prescriptions prior to admission    No results found for this or any previous visit (from the past 32 hour(s)). No results found.  Review of Systems  Constitutional: Negative.   HENT: Negative.   Eyes: Negative.   Respiratory: Negative.   Cardiovascular: Negative.   Gastrointestinal: Negative.   Genitourinary: Negative.   Musculoskeletal: Negative.   Skin: Negative.   Neurological: Negative.   Endo/Heme/Allergies: Negative.   Psychiatric/Behavioral: Negative.     Last menstrual period 09/07/2012. Physical Exam   Assessment/Plan     1 - Nephrolithiasis - rec right URS in elective setting. SWL also possible but less favored given lower pole location and body habitus and desire for complete fragment clearance to reduce future UTI risk.  We rediscussed ureteroscopic stone manipulation with basketing and laser-lithotripsy in detail.  Were discussed risks including bleeding, infection, damage to kidney / ureter  bladder, rarely loss of kidney. We discussed anesthetic risks and rare but serious surgical complications including DVT, PE, MI, and mortality. We specifically readdressed that in 5-10% of cases a staged approach is required with stenting followed by re-attempt ureteroscopy if anatomy unfavorable.   The patient voiced understanding and wises to proceed today as planned.    2 - Urinary Tract  Infection - she has been on ABX and antifungal pre-op to reduce risk.    Tobin Cadiente 05/30/2015, 8:26 AM

## 2015-05-30 NOTE — Brief Op Note (Signed)
05/30/2015  4:17 PM  PATIENT:  Lydia [name]  54 y.o. female  PRE-OPERATIVE DIAGNOSIS:  RIGHT RENAL STONE  POST-OPERATIVE DIAGNOSIS:  right renal stone  PROCEDURE:  Procedure(s): CYSTOSCOPY WITH RETROGRADE PYELOGRAM, URETEROSCOPY AND STENT EXCHANGE (Right) HOLMIUM LASER APPLICATION (Right)  SURGEON:  Surgeon(s) and Role:    * Alexis Frock, MD - Primary  PHYSICIAN ASSISTANT:   ASSISTANTS: none   ANESTHESIA:   general  EBL:     BLOOD ADMINISTERED:none  DRAINS: none   LOCAL MEDICATIONS USED:  NONE  SPECIMEN:  Source of Specimen:  right renal stone  DISPOSITION OF SPECIMEN:  [facility] for compositional analysis  COUNTS:  YES  TOURNIQUET:  * No tourniquets in log *  DICTATION: .Other Dictation: Dictation Number [id number]  PLAN OF CARE: Discharge to home after PACU  PATIENT DISPOSITION:  PACU - hemodynamically stable.   Delay start of Pharmacological VTE agent (>24hrs) due to surgical blood loss or risk of bleeding: not applicable

## 2015-05-30 NOTE — Transfer of Care (Signed)
Immediate Anesthesia Transfer of Care Note  Patient: Lydia [name]  Procedure(s) Performed: Procedure(s): CYSTOSCOPY WITH RETROGRADE PYELOGRAM, URETEROSCOPY AND STENT EXCHANGE (Right) HOLMIUM LASER APPLICATION (Right)  Patient Location: PACU  Anesthesia Type:General  Level of Consciousness: awake, alert , oriented and patient cooperative  Airway & Oxygen Therapy: Patient Spontanous Breathing and Patient connected to face mask oxygen  Post-op Assessment: Report given to RN, Post -op Vital signs reviewed and stable and Patient moving all extremities X 4  Post vital signs: stable  Last Vitals:  Filed Vitals:   05/30/15 1302  BP: 146/90  Pulse: 77  Temp: 37.3 C  Resp: 18    Complications: No apparent anesthesia complications

## 2015-05-30 NOTE — Anesthesia Postprocedure Evaluation (Signed)
  Anesthesia Post-op Note  Patient: Lydia [name]  Procedure(s) Performed: Procedure(s): CYSTOSCOPY WITH RETROGRADE PYELOGRAM, URETEROSCOPY AND STENT EXCHANGE (Right) HOLMIUM LASER APPLICATION (Right)  Patient Location: PACU  Anesthesia Type:General  Level of Consciousness: awake  Airway and Oxygen Therapy: Patient Spontanous Breathing  Post-op Pain: mild  Post-op Assessment: Post-op Vital signs reviewed              Post-op Vital Signs: Reviewed  Last Vitals:  Filed Vitals:   05/30/15 1302  BP: 146/90  Pulse: 77  Temp: 37.3 C  Resp: 18    Complications: No apparent anesthesia complications

## 2015-05-30 NOTE — Progress Notes (Signed)
Stent in place.

## 2015-05-31 ENCOUNTER — Encounter (HOSPITAL_COMMUNITY): Payer: Self-pay | Admitting: Urology

## 2015-05-31 NOTE — Anesthesia Postprocedure Evaluation (Signed)
  Anesthesia Post-op Note  Patient: Lydia [name]  Procedure(s) Performed: Procedure(s) (LRB): CYSTOSCOPY WITH RETROGRADE PYELOGRAM, URETEROSCOPY AND STENT EXCHANGE (Right) HOLMIUM LASER APPLICATION (Right)  Patient Location: PACU  Anesthesia Type: General  Level of Consciousness: awake and alert   Airway and Oxygen Therapy: Patient Spontanous Breathing  Post-op Pain: mild  Post-op Assessment: Post-op Vital signs reviewed, Patient's Cardiovascular Status Stable, Respiratory Function Stable, Patent Airway and No signs of Nausea or vomiting  Last Vitals:  Filed Vitals:   05/30/15 1743  BP: 115/78  Pulse: 77  Temp:   Resp: 18    Post-op Vital Signs: stable   Complications: No apparent anesthesia complications

## 2015-05-31 NOTE — Op Note (Signed)
Lydia [name], Lydia [name]               ACCOUNT NO.:  [id number]  MEDICAL RECORD NO.:  [id number]  LOCATION:  [facility]                         FACILITY:  [facility]  PHYSICIAN:  Alexis Frock, MD     DATE OF BIRTH:  1961-04-02  DATE OF PROCEDURE: 05/30/2015                               OPERATIVE REPORT   PREOPERATIVE DIAGNOSES:  Right-sided nephrolithiasis, history of urosepsis, stats post prior stenting.  PROCEDURE: 1. Cystoscopy with right retrograde pyelogram interpretation, right. 2. Right ureteroscopy with laser lithotripsy. 3. Exchange of right ureteral stent with tether.  ESTIMATED BLOOD LOSS:  Nil.  COMPLICATIONS:  None.  SPECIMENS:  Right renal stone fragments for compositional analysis.  FINDINGS: 1. Previous right ureteral stone now residing in the extreme lower     pole and calyx. 2. Complete resolution of all stone fragments larger than 1/3 mm     following laser lithotripsy and basket extraction. 3. Successful placement of right ureteral stent, proximal and renal     pelvis, distal in the urinary bladder.  INDICATION:  Ms. Lynk is a 54 year old lady with history of diabetes and morbid obesity who had an episode of septic obstructive pyelonephritis.  She did have a right ureteral stent several weeks ago. She was temporizing stenting, and IV antibiotics were transitioned to oral based on cultures she is since clear to infectious parameters. Followup urinary cultures and negative for bacteria.  She does also have history of funguria and has been on antifungal as well.  Options were discussed for definitive stone management including ureteroscopy versus shockwave lithotripsy as our goal is stone-free.  She wished to proceed with right ureteroscopic stone manipulation.  Informed consent was obtained and placed in medical record.  PROCEDURE IN DETAIL:  The patient is being Novie Maggio and her procedure being right ureteroscopic stone manipulation was  confirmed. Procedure was carried out.  Time-out was performed.  Intravenous antibiotics administered.  General LMA anesthesia was introduced.  The patient was placed into a low lithotomy position.  Sterile field was created by prepping and draping the patient's vagina, introitus, and proximal thighs using iodine x3.  Next, cystourethroscopy was performed using a 23-French rigid cystoscope with 30-degree offset lens. Inspection of bladder revealed no diverticula, calcifications, papular lesions.  Distal end of right ureteral stent was seen in situ.  This was grasped, brought to the level of the urethral meatus, through which a 0.038 zip wire was advanced to the level of the upper pole and exchanged for a 6-French end-hole catheter, and right retrograde pyelogram was obtained.  Right retrograde pyelogram demonstrated a single right ureter, single- system right kidney.  No filling defects or narrowing.  There was a radiodensity in the extreme lower pole consistent with possible displacement of prior ureteral stent.  The zip wire was once again advanced and set aside as a safety wire.  An 8-French feeding tube was placed in urinary bladder for pressure release.  Next, semi-rigid ureteroscopy was performed to the distal 3/4 of the right ureter alongside a separate Sensor working wire.  No mucosal abnormalities were found.  The semi-rigid scope was exchanged for a 12/14, 24 cm ureteral access sheath using fluoroscopic guidance to the level  of proximal ureter.  Next, flexible digital ureteroscopy was performed using a single channel flexible digital ureteroscope and inspection of the kidney and did in fact reveal displacement of prior ureteral stone into an extreme lower pole angled calyx.  This was somewhat difficult to cannulate with ureteroscope; however, we did successfully visualize the stone.  It was grasped with Escape basket and repositioned into an upper pole to allow better  angulation of her stone manipulation.  The stone did appear to be much too large for simple basketing, as such holmium laser energy was applied to the stone using settings of 0.3 joules and 20 Hz fragmenting it into approximately 4 smaller pieces.  These were then sequentially grasped, brought out their entirety with an escape- type basket set aside for compositional analysis.  Following these maneuvers, there was complete resolution of all stone fragments larger than 1/3 mm.  Excellent hemostasis.  No evidence of perforation.  The sheath was removed under continuous ureteroscopic vision, and no mucosal abnormalities were found.  Given use of the access sheath and the patient's prior infectious parameters, it was felt that interval stenting was warranted.  As such, a new contra type stent was placed over the remaining safety wire using fluoroscopic guidance.  Good proximal and distal deployment were noted.  The tether was left in place and fashioned to itself in a tampon string type fashion and left in situ.  Procedure was terminated.  The patient tolerated the procedure well.  There were no immediate periprocedural complications.  The patient was taken to the postanesthesia care unit in stable condition.          ______________________________ Alexis Frock, MD     TM/MEDQ  D:  05/30/2015  T:  05/31/2015  Job:  [id number]

## 2015-06-06 ENCOUNTER — Other Ambulatory Visit: Payer: Self-pay | Admitting: Orthopedic Surgery

## 2015-06-21 ENCOUNTER — Encounter (HOSPITAL_COMMUNITY): Payer: Self-pay

## 2015-06-21 ENCOUNTER — Encounter (HOSPITAL_COMMUNITY)
Admission: RE | Admit: 2015-06-21 | Discharge: 2015-06-21 | Disposition: A | Discharge status: Home / Self Care | Payer: BC Managed Care – PPO | Source: Ambulatory Visit | Attending: Orthopedic Surgery | Admitting: Orthopedic Surgery

## 2015-06-21 DIAGNOSIS — M179 Osteoarthritis of knee, unspecified: Secondary | ICD-10-CM | POA: Insufficient documentation

## 2015-06-21 DIAGNOSIS — Z01812 Encounter for preprocedural laboratory examination: Secondary | ICD-10-CM | POA: Diagnosis present

## 2015-06-21 HISTORY — DX: Transient cerebral ischemic attack, unspecified: G45.9

## 2015-06-21 HISTORY — DX: Pneumonia, unspecified organism: J18.9

## 2015-06-21 HISTORY — DX: Gastro-esophageal reflux disease without esophagitis: K21.9

## 2015-06-21 HISTORY — DX: Calculus of kidney: N20.0

## 2015-06-21 LAB — CBC
HCT: 41.6 % (ref 36.0–46.0)
Hemoglobin: 14.3 g/dL (ref 12.0–15.0)
MCH: 28.7 pg (ref 26.0–34.0)
MCHC: 34.4 g/dL (ref 30.0–36.0)
MCV: 83.5 fL (ref 78.0–100.0)
Platelets: 267 10*3/uL (ref 150–400)
RBC: 4.98 MIL/uL (ref 3.87–5.11)
RDW: 14.3 % (ref 11.5–15.5)
WBC: 9.4 10*3/uL (ref 4.0–10.5)

## 2015-06-21 LAB — BASIC METABOLIC PANEL
Anion gap: 9 (ref 5–15)
BUN: 11 mg/dL (ref 6–20)
CO2: 24 mmol/L (ref 22–32)
CREATININE: 0.6 mg/dL (ref 0.44–1.00)
Calcium: 9.1 mg/dL (ref 8.9–10.3)
Chloride: 103 mmol/L (ref 101–111)
GFR calc Af Amer: >60 mL/min (ref >60)
Glucose, Bld: 187 mg/dL — ABNORMAL HIGH (ref 65–99)
POTASSIUM: 4.1 mmol/L (ref 3.5–5.1)
SODIUM: 136 mmol/L (ref 135–145)

## 2015-06-21 LAB — GLUCOSE, CAPILLARY: Glucose-Capillary: 230 mg/dL — ABNORMAL HIGH (ref 65–99)

## 2015-06-21 LAB — SURGICAL PCR SCREEN
MRSA, PCR: NEGATIVE
Staphylococcus aureus: NEGATIVE

## 2015-06-21 NOTE — Pre-Procedure Instructions (Signed)
    Lydia [name]  06/21/2015      [facility] [address], [employer] [employer] - [place], [address] [place] [address] Phone: [phone] Fax: [phone]  [place], - [place], [place] - [address] [place] [address] [place] [address] Phone: [phone] Fax: [phone]    Your procedure is scheduled on 07/02/2015.  Report to [facility] Admitting at 5:30 A.M.  Call this number if you have questions in the days prior to your surgery:  [phone]  Call this number if you have problems the morning of surgery:  [phone]   STOP: All Vitamins, Supplements, Herbal Medications, Fish Oils, Aspirins, NSAIDs (Nonsteroidal  Anti-inflammatories like Ibuprofen, Advil, or Aleve, also Diclofenac), and Goody's/BC Powders 7 days prior to surgery.    Remember:  Do not eat food or drink liquids after midnight.  Take these medicines the morning of surgery with A SIP OF WATER: Inhaler, Cymbalta (duloxetine),  Fluconazole (Diflucan),  Omeprazole (Prilosec), Roxicet, Senna-docusate (Senokot),  Sulfamethoxazole-trimethoprim (Bactrim/Septra)   Do not wear jewelry, make-up or nail polish.  Do not wear lotions, powders, or perfumes.  You may wear deodorant.  Do not shave 48 hours prior to surgery.    Do not bring valuables to the hospital.  [facility] is not responsible for any belongings or valuables.  Contacts, dentures or bridgework may not be worn into surgery.  Leave your suitcase in the car.  After surgery it may be brought to your room.  For patients admitted to the hospital, discharge time will be determined by your treatment team.  Patients discharged the day of surgery will not be allowed to drive home.   Please read over the following fact sheets that you were given. Pain Booklet, Coughing and Deep Breathing, MRSA Information and Surgical Site Infection Prevention

## 2015-06-21 NOTE — Progress Notes (Signed)
   06/21/15 1038  OBSTRUCTIVE SLEEP APNEA  Have you ever been diagnosed with sleep apnea through a sleep study? No  Do you snore loudly (loud enough to be heard through closed doors)?  0  Do you often feel tired, fatigued, or sleepy during the daytime? 1  Has anyone observed you stop breathing during your sleep? 0  Do you have, or are you being treated for high blood pressure? 1  BMI more than 35 kg/m2? 1  Age over 52 years old? 1  Neck circumference greater than 40 cm/16 inches? 1 (16.5)  Gender: 0

## 2015-06-22 LAB — HEMOGLOBIN A1C
HEMOGLOBIN A1C: 7.8 % — AB (ref 4.8–5.6)
MEAN PLASMA GLUCOSE: 177 mg/dL

## 2015-06-29 NOTE — H&P (Signed)
TOTAL KNEE ADMISSION H&P  Patient is being admitted for left total knee arthroplasty.  Subjective:  Chief Complaint:left knee pain.  HPI: Lydia [name], 54 y.o. female, has a history of pain and functional disability in the left knee due to arthritis and has failed non-surgical conservative treatments for greater than 12 weeks to includeNSAID's and/or analgesics, viscosupplementation injections, flexibility and strengthening excercises, weight reduction as appropriate and activity modification.  Onset of symptoms was gradual, starting 3 years ago with gradually worsening course since that time. The patient noted no past surgery on the left knee(s).  Patient currently rates pain in the left knee(s) at 10 out of 10 with activity. Patient has night pain, worsening of pain with activity and weight bearing, pain that interferes with activities of daily living and pain with passive range of motion.  Patient has evidence of joint subluxation and joint space narrowing by imaging studies.  There is no active infection.  Patient Active Problem List   Diagnosis Date Noted  . Nephrolithiasis 04/29/2015  . TIA (transient ischemic attack) 02/10/2013  . Hyperlipidemia 02/10/2013  . Osteoarthritis 02/10/2013  . History of PCOS 10/29/2012  . HTN (hypertension), benign 10/11/2012  . Diabetes mellitus, type II 10/11/2012   Past Medical History  Diagnosis Date  . PCOS (polycystic ovarian syndrome)   . Hypertension   . Osteoarthritis   . Family history of adverse reaction to anesthesia     5 YR SON  HAD HERNIA REPAIR AND GOT NAUSEATED AFTER  . Asthma     SEASONAL  ASTHMA,  COMES ON WITH RESPIRATORY INFECTIONS.  Marland Kitchen Anxiety   . Kidney stone on right side     Admission May 2016   . TIA (transient ischemic attack)     March 2015, 2013  . Pneumonia     several times  . Diabetes mellitus     Type 2  . GERD (gastroesophageal reflux disease)     Past Surgical History  Procedure Laterality Date  .  Cesarean section  2001  . Tonsillectomy    . Cystoscopy w/ ureteral stent placement Right 04/29/2015    Procedure: CYSTOSCOPY WITH RETROGRADE PYELOGRAM/URETERAL STENT PLACEMENT;  Surgeon: Alexis Frock, MD;  Location: [facility];  Service: Urology;  Laterality: Right;  . Cystoscopy with retrograde pyelogram, ureteroscopy and stent placement Right 05/30/2015    Procedure: CYSTOSCOPY WITH RETROGRADE PYELOGRAM, URETEROSCOPY AND STENT EXCHANGE;  Surgeon: Alexis Frock, MD;  Location: [facility];  Service: Urology;  Laterality: Right;  . Holmium laser application Right 3/84/6659    Procedure: HOLMIUM LASER APPLICATION;  Surgeon: Alexis Frock, MD;  Location: [facility];  Service: Urology;  Laterality: Right;  . Colonoscopy w/ biopsies  2015  . Upper gi endoscopy      No prescriptions prior to admission   Allergies  Allergen Reactions  . Adhesive [Tape] Rash    EKG tape    History  Substance Use Topics  . Smoking status: Current Every Day Smoker -- 0.50 packs/day for 1 years    Types: Cigarettes  . Smokeless tobacco: Never Used  . Alcohol Use: Yes     Comment: RARE TO OCCASIONALLY    Family History  Problem Relation Age of Onset  . Hypertension Father   . Cancer Father     PROSTATE  . Diabetes Paternal Aunt   . Hypertension Paternal Aunt   . Cancer Paternal Aunt     pancreatic  . Diabetes Paternal Grandmother   . Cancer Paternal Grandmother  leukemia     Review of Systems  Constitutional: Negative.   HENT: Negative.   Eyes: Negative.        Glasses  Respiratory: Negative.        Asthma  Cardiovascular: Negative.        Htn and TIA  Gastrointestinal: Negative.   Genitourinary: Negative.   Musculoskeletal: Positive for joint pain.  Skin: Negative.   Neurological: Negative.   Endo/Heme/Allergies: Negative.        Diabetes  Psychiatric/Behavioral: Positive for depression.    Objective:  Physical Exam  Constitutional: She is oriented to person, place, and time. She appears  well-developed and well-nourished.  HENT:  Head: Normocephalic and atraumatic.  Eyes: Pupils are equal, round, and reactive to light.  Neck: Normal range of motion. Neck supple.  Cardiovascular: Intact distal pulses.   Respiratory: Effort normal.  Musculoskeletal:  The left knee has not obvious varus deformity, 10 flexion contracture, flexes 120 limited by adipose tissue.    Neurological: She is alert and oriented to person, place, and time.  Skin: Skin is warm and dry.  Psychiatric: She has a normal mood and affect. Her behavior is normal. Judgment and thought content normal.    Vital signs in last 24 hours:    Labs:   Estimated body mass index is 40.75 kg/(m^2) as calculated from the following:   Height as of 05/30/15: 5\' 4"  (1.626 m).   Weight as of 05/30/15: 107.729 kg (237 lb 8 oz).   Imaging Review Plain radiographs demonstrate bone-on-bone arthritic changes with lateral subluxation of tibia beneath the femur.  Assessment/Plan:  End stage arthritis, left knee   The patient history, physical examination, clinical judgment of the provider and imaging studies are consistent with end stage degenerative joint disease of the left knee(s) and total knee arthroplasty is deemed medically necessary. The treatment options including medical management, injection therapy arthroscopy and arthroplasty were discussed at length. The risks and benefits of total knee arthroplasty were presented and reviewed. The risks due to aseptic loosening, infection, stiffness, patella tracking problems, thromboembolic complications and other imponderables were discussed. The patient acknowledged the explanation, agreed to proceed with the plan and consent was signed. Patient is being admitted for inpatient treatment for surgery, pain control, PT, OT, prophylactic antibiotics, VTE prophylaxis, progressive ambulation and ADL's and discharge planning. The patient is planning to be discharged home with home  health services

## 2015-07-01 DIAGNOSIS — M1712 Unilateral primary osteoarthritis, left knee: Secondary | ICD-10-CM | POA: Diagnosis present

## 2015-07-02 ENCOUNTER — Encounter (HOSPITAL_COMMUNITY): Payer: Self-pay | Admitting: *Deleted

## 2015-07-02 ENCOUNTER — Encounter (HOSPITAL_COMMUNITY)
Admission: RE | Disposition: A | Discharge status: Home / Self Care | Payer: Self-pay | Source: Ambulatory Visit | Attending: Orthopedic Surgery

## 2015-07-02 ENCOUNTER — Inpatient Hospital Stay (HOSPITAL_COMMUNITY): Payer: BC Managed Care – PPO | Admitting: Certified Registered Nurse Anesthetist

## 2015-07-02 ENCOUNTER — Inpatient Hospital Stay (HOSPITAL_COMMUNITY)
Admission: RE | Admit: 2015-07-02 | Discharge: 2015-07-05 | DRG: 470 | Disposition: A | Discharge status: Home / Self Care | Payer: BC Managed Care – PPO | Source: Ambulatory Visit | Attending: Orthopedic Surgery | Admitting: Orthopedic Surgery

## 2015-07-02 DIAGNOSIS — I10 Essential (primary) hypertension: Secondary | ICD-10-CM | POA: Diagnosis present

## 2015-07-02 DIAGNOSIS — E119 Type 2 diabetes mellitus without complications: Secondary | ICD-10-CM | POA: Diagnosis present

## 2015-07-02 DIAGNOSIS — F419 Anxiety disorder, unspecified: Secondary | ICD-10-CM | POA: Diagnosis present

## 2015-07-02 DIAGNOSIS — K219 Gastro-esophageal reflux disease without esophagitis: Secondary | ICD-10-CM | POA: Diagnosis present

## 2015-07-02 DIAGNOSIS — F17210 Nicotine dependence, cigarettes, uncomplicated: Secondary | ICD-10-CM | POA: Diagnosis present

## 2015-07-02 DIAGNOSIS — E785 Hyperlipidemia, unspecified: Secondary | ICD-10-CM | POA: Diagnosis present

## 2015-07-02 DIAGNOSIS — Z8673 Personal history of transient ischemic attack (TIA), and cerebral infarction without residual deficits: Secondary | ICD-10-CM | POA: Diagnosis not present

## 2015-07-02 DIAGNOSIS — Z6841 Body Mass Index (BMI) 40.0 and over, adult: Secondary | ICD-10-CM

## 2015-07-02 DIAGNOSIS — E282 Polycystic ovarian syndrome: Secondary | ICD-10-CM | POA: Diagnosis present

## 2015-07-02 DIAGNOSIS — J45909 Unspecified asthma, uncomplicated: Secondary | ICD-10-CM | POA: Diagnosis present

## 2015-07-02 DIAGNOSIS — M1712 Unilateral primary osteoarthritis, left knee: Secondary | ICD-10-CM | POA: Diagnosis present

## 2015-07-02 DIAGNOSIS — D62 Acute posthemorrhagic anemia: Secondary | ICD-10-CM | POA: Diagnosis not present

## 2015-07-02 DIAGNOSIS — Z91048 Other nonmedicinal substance allergy status: Secondary | ICD-10-CM

## 2015-07-02 DIAGNOSIS — Z87442 Personal history of urinary calculi: Secondary | ICD-10-CM | POA: Diagnosis not present

## 2015-07-02 DIAGNOSIS — Z8701 Personal history of pneumonia (recurrent): Secondary | ICD-10-CM | POA: Diagnosis not present

## 2015-07-02 DIAGNOSIS — M1710 Unilateral primary osteoarthritis, unspecified knee: Secondary | ICD-10-CM | POA: Diagnosis present

## 2015-07-02 DIAGNOSIS — M25562 Pain in left knee: Secondary | ICD-10-CM | POA: Diagnosis present

## 2015-07-02 HISTORY — PX: TOTAL KNEE ARTHROPLASTY: SHX125

## 2015-07-02 LAB — GLUCOSE, CAPILLARY
Glucose-Capillary: 178 mg/dL — ABNORMAL HIGH (ref 65–99)
Glucose-Capillary: 254 mg/dL — ABNORMAL HIGH (ref 65–99)
Glucose-Capillary: 176 mg/dL — ABNORMAL HIGH (ref 65–99)
Glucose-Capillary: 196 mg/dL — ABNORMAL HIGH (ref 65–99)
Glucose-Capillary: 158 mg/dL — ABNORMAL HIGH (ref 65–99)

## 2015-07-02 SURGERY — ARTHROPLASTY, KNEE, TOTAL
Anesthesia: Spinal | Site: Knee | Laterality: Left

## 2015-07-02 MED ORDER — FENTANYL CITRATE (PF) 250 MCG/5ML IJ SOLN
INTRAMUSCULAR | Status: AC
  Filled 2015-07-02: qty 5

## 2015-07-02 MED ORDER — ALBUTEROL SULFATE (2.5 MG/3ML) 0.083% IN NEBU: 2.0000 mL | INHALATION_SOLUTION | Freq: Four times a day (QID) | RESPIRATORY_TRACT | Status: DC | PRN

## 2015-07-02 MED ORDER — KCL IN DEXTROSE-NACL 20-5-0.45 MEQ/L-%-% IV SOLN
INTRAVENOUS | Status: DC
  Administered 2015-07-02: 20:00:00 via INTRAVENOUS
  Filled 2015-07-02 (×3): qty 1000

## 2015-07-02 MED ORDER — METHOCARBAMOL 500 MG PO TABS: 500.0000 mg | ORAL_TABLET | Freq: Two times a day (BID) | ORAL | Status: DC

## 2015-07-02 MED ORDER — OXYCODONE-ACETAMINOPHEN 5-325 MG PO TABS: 1.0000 | ORAL_TABLET | ORAL | Status: DC | PRN

## 2015-07-02 MED ORDER — HYDROMORPHONE HCL 1 MG/ML IJ SOLN: 0.2500 mg | INTRAMUSCULAR | Status: DC | PRN

## 2015-07-02 MED ORDER — CEFAZOLIN SODIUM-DEXTROSE 2-3 GM-% IV SOLR
INTRAVENOUS | Status: AC
  Filled 2015-07-02: qty 50

## 2015-07-02 MED ORDER — METHOCARBAMOL 1000 MG/10ML IJ SOLN: 500.0000 mg | Freq: Four times a day (QID) | INTRAVENOUS | Status: DC | PRN

## 2015-07-02 MED ORDER — SODIUM CHLORIDE 0.9 % IR SOLN
Status: DC | PRN
  Administered 2015-07-02: 1000 mL
  Administered 2015-07-02: 3000 mL

## 2015-07-02 MED ORDER — OXYCODONE HCL 5 MG PO TABS
5.0000 mg | ORAL_TABLET | ORAL | Status: DC | PRN
  Administered 2015-07-02 – 2015-07-05 (×16): 10 mg via ORAL
  Filled 2015-07-02 (×15): qty 2

## 2015-07-02 MED ORDER — LINAGLIPTIN 5 MG PO TABS
5.0000 mg | ORAL_TABLET | Freq: Every day | ORAL | Status: DC
  Administered 2015-07-02 – 2015-07-05 (×4): 5 mg via ORAL
  Filled 2015-07-02 (×4): qty 1

## 2015-07-02 MED ORDER — METHOCARBAMOL 500 MG PO TABS
ORAL_TABLET | ORAL | Status: AC
  Filled 2015-07-02: qty 1

## 2015-07-02 MED ORDER — CHLORHEXIDINE GLUCONATE 4 % EX LIQD: 60.0000 mL | Freq: Once | CUTANEOUS | Status: DC

## 2015-07-02 MED ORDER — BUPIVACAINE LIPOSOME 1.3 % IJ SUSP
20.0000 mL | INTRAMUSCULAR | Status: DC
  Filled 2015-07-02: qty 20

## 2015-07-02 MED ORDER — CEFAZOLIN SODIUM-DEXTROSE 2-3 GM-% IV SOLR
2.0000 g | INTRAVENOUS | Status: AC
  Administered 2015-07-02: 2 g via INTRAVENOUS

## 2015-07-02 MED ORDER — FENTANYL CITRATE (PF) 100 MCG/2ML IJ SOLN
INTRAMUSCULAR | Status: DC | PRN
  Administered 2015-07-02 (×8): 50 ug via INTRAVENOUS
  Administered 2015-07-02: 100 ug via INTRAVENOUS

## 2015-07-02 MED ORDER — MEPERIDINE HCL 25 MG/ML IJ SOLN: 6.2500 mg | INTRAMUSCULAR | Status: DC | PRN

## 2015-07-02 MED ORDER — LIDOCAINE HCL (CARDIAC) 20 MG/ML IV SOLN
INTRAVENOUS | Status: DC | PRN
  Administered 2015-07-02: 50 mg via INTRAVENOUS

## 2015-07-02 MED ORDER — ACETAMINOPHEN 650 MG RE SUPP: 650.0000 mg | Freq: Four times a day (QID) | RECTAL | Status: DC | PRN

## 2015-07-02 MED ORDER — HYDROCHLOROTHIAZIDE 12.5 MG PO CAPS
12.5000 mg | ORAL_CAPSULE | Freq: Every day | ORAL | Status: DC
  Administered 2015-07-02 – 2015-07-05 (×4): 12.5 mg via ORAL
  Filled 2015-07-02 (×4): qty 1

## 2015-07-02 MED ORDER — PROPOFOL 10 MG/ML IV BOLUS
INTRAVENOUS | Status: AC
  Filled 2015-07-02: qty 20

## 2015-07-02 MED ORDER — HYDROMORPHONE HCL 1 MG/ML IJ SOLN
INTRAMUSCULAR | Status: DC
Start: 2015-07-02 — End: 2015-07-02
  Filled 2015-07-02: qty 2

## 2015-07-02 MED ORDER — OMEPRAZOLE 20 MG PO CPDR
20.0000 mg | DELAYED_RELEASE_CAPSULE | Freq: Every day | ORAL | Status: DC
  Administered 2015-07-02 – 2015-07-04 (×3): 20 mg via ORAL
  Filled 2015-07-02 (×5): qty 1

## 2015-07-02 MED ORDER — BUPIVACAINE IN DEXTROSE 0.75-8.25 % IT SOLN
INTRATHECAL | Status: DC | PRN
  Administered 2015-07-02: 12 mL via INTRATHECAL

## 2015-07-02 MED ORDER — MIDAZOLAM HCL 2 MG/2ML IJ SOLN
INTRAMUSCULAR | Status: AC
  Filled 2015-07-02: qty 2

## 2015-07-02 MED ORDER — LISINOPRIL-HYDROCHLOROTHIAZIDE 20-25 MG PO TABS: 0.5000 | ORAL_TABLET | Freq: Every day | ORAL | Status: DC

## 2015-07-02 MED ORDER — LACTATED RINGERS IV SOLN
INTRAVENOUS | Status: DC | PRN
  Administered 2015-07-02 (×2): via INTRAVENOUS

## 2015-07-02 MED ORDER — BUPIVACAINE LIPOSOME 1.3 % IJ SUSP
20.0000 mL | Freq: Once | INTRAMUSCULAR | Status: DC
  Filled 2015-07-02: qty 20

## 2015-07-02 MED ORDER — DIPHENHYDRAMINE HCL 12.5 MG/5ML PO ELIX: 12.5000 mg | ORAL_SOLUTION | ORAL | Status: DC | PRN

## 2015-07-02 MED ORDER — BUPIVACAINE-EPINEPHRINE (PF) 0.5% -1:200000 IJ SOLN
INTRAMUSCULAR | Status: DC | PRN
  Administered 2015-07-02: 30 mL

## 2015-07-02 MED ORDER — SENNOSIDES-DOCUSATE SODIUM 8.6-50 MG PO TABS: 1.0000 | ORAL_TABLET | Freq: Every evening | ORAL | Status: DC | PRN

## 2015-07-02 MED ORDER — PROMETHAZINE HCL 25 MG/ML IJ SOLN: 6.2500 mg | INTRAMUSCULAR | Status: DC | PRN

## 2015-07-02 MED ORDER — LISINOPRIL 10 MG PO TABS
10.0000 mg | ORAL_TABLET | Freq: Every day | ORAL | Status: DC
  Administered 2015-07-02 – 2015-07-05 (×4): 10 mg via ORAL
  Filled 2015-07-02 (×4): qty 1

## 2015-07-02 MED ORDER — PHENOL 1.4 % MT LIQD: 1.0000 | OROMUCOSAL | Status: DC | PRN

## 2015-07-02 MED ORDER — METHOCARBAMOL 500 MG PO TABS
500.0000 mg | ORAL_TABLET | Freq: Four times a day (QID) | ORAL | Status: DC | PRN
  Administered 2015-07-02 – 2015-07-04 (×4): 500 mg via ORAL
  Filled 2015-07-02 (×3): qty 1

## 2015-07-02 MED ORDER — METOCLOPRAMIDE HCL 5 MG/ML IJ SOLN
5.0000 mg | Freq: Three times a day (TID) | INTRAMUSCULAR | Status: DC | PRN
  Administered 2015-07-02: 10 mg via INTRAVENOUS
  Filled 2015-07-02: qty 2

## 2015-07-02 MED ORDER — BUPIVACAINE LIPOSOME 1.3 % IJ SUSP
INTRAMUSCULAR | Status: DC | PRN
  Administered 2015-07-02: 20 mL

## 2015-07-02 MED ORDER — MIDAZOLAM HCL 2 MG/2ML IJ SOLN
0.5000 mg | Freq: Once | INTRAMUSCULAR | Status: AC | PRN
  Administered 2015-07-02: 2 mg via INTRAVENOUS

## 2015-07-02 MED ORDER — DEXTROSE-NACL 5-0.45 % IV SOLN: INTRAVENOUS | Status: DC

## 2015-07-02 MED ORDER — SODIUM CHLORIDE 0.9 % IJ SOLN
INTRAMUSCULAR | Status: DC | PRN
  Administered 2015-07-02: 40 mL

## 2015-07-02 MED ORDER — OXYCODONE HCL 5 MG PO TABS
ORAL_TABLET | ORAL | Status: AC
  Filled 2015-07-02: qty 2

## 2015-07-02 MED ORDER — ASPIRIN EC 325 MG PO TBEC
325.0000 mg | DELAYED_RELEASE_TABLET | Freq: Every day | ORAL | Status: DC
Start: 2015-07-03 — End: 2015-07-05
  Administered 2015-07-03 – 2015-07-05 (×3): 325 mg via ORAL
  Filled 2015-07-02 (×3): qty 1

## 2015-07-02 MED ORDER — HYDROMORPHONE HCL 1 MG/ML IJ SOLN
1.0000 mg | INTRAMUSCULAR | Status: DC | PRN
  Administered 2015-07-02 – 2015-07-03 (×6): 1 mg via INTRAVENOUS
  Filled 2015-07-02 (×6): qty 1

## 2015-07-02 MED ORDER — MIDAZOLAM HCL 5 MG/5ML IJ SOLN
INTRAMUSCULAR | Status: DC | PRN
  Administered 2015-07-02: 2 mg via INTRAVENOUS

## 2015-07-02 MED ORDER — DULOXETINE HCL 60 MG PO CPEP
120.0000 mg | ORAL_CAPSULE | Freq: Every day | ORAL | Status: DC
  Administered 2015-07-03 – 2015-07-05 (×3): 120 mg via ORAL
  Filled 2015-07-02 (×4): qty 2

## 2015-07-02 MED ORDER — CEFUROXIME SODIUM 1.5 G IJ SOLR
INTRAMUSCULAR | Status: DC | PRN
  Administered 2015-07-02: 1.5 g

## 2015-07-02 MED ORDER — PROPOFOL INFUSION 10 MG/ML OPTIME
INTRAVENOUS | Status: DC | PRN
  Administered 2015-07-02: 75 ug/kg/min via INTRAVENOUS

## 2015-07-02 MED ORDER — TRANEXAMIC ACID 1000 MG/10ML IV SOLN
2000.0000 mg | Freq: Once | INTRAVENOUS | Status: DC
  Filled 2015-07-02: qty 20

## 2015-07-02 MED ORDER — INSULIN ASPART 100 UNIT/ML ~~LOC~~ SOLN
0.0000 [IU] | Freq: Three times a day (TID) | SUBCUTANEOUS | Status: DC
  Administered 2015-07-02 – 2015-07-03 (×2): 4 [IU] via SUBCUTANEOUS
  Administered 2015-07-03: 7 [IU] via SUBCUTANEOUS
  Administered 2015-07-03 – 2015-07-04 (×2): 11 [IU] via SUBCUTANEOUS
  Administered 2015-07-04 (×2): 4 [IU] via SUBCUTANEOUS
  Administered 2015-07-05: 7 [IU] via SUBCUTANEOUS

## 2015-07-02 MED ORDER — SODIUM CHLORIDE 0.9 % IJ SOLN
INTRAMUSCULAR | Status: AC
  Filled 2015-07-02: qty 40

## 2015-07-02 MED ORDER — MENTHOL 3 MG MT LOZG: 1.0000 | LOZENGE | OROMUCOSAL | Status: DC | PRN

## 2015-07-02 MED ORDER — ACETAMINOPHEN 325 MG PO TABS: 650.0000 mg | ORAL_TABLET | Freq: Four times a day (QID) | ORAL | Status: DC | PRN

## 2015-07-02 MED ORDER — PROPOFOL 10 MG/ML IV BOLUS
INTRAVENOUS | Status: DC | PRN
  Administered 2015-07-02: 40 mg via INTRAVENOUS
  Administered 2015-07-02: 140 mg via INTRAVENOUS
  Administered 2015-07-02: 40 mg via INTRAVENOUS

## 2015-07-02 MED ORDER — ALUM & MAG HYDROXIDE-SIMETH 200-200-20 MG/5ML PO SUSP: 30.0000 mL | ORAL | Status: DC | PRN

## 2015-07-02 MED ORDER — DOCUSATE SODIUM 100 MG PO CAPS
100.0000 mg | ORAL_CAPSULE | Freq: Two times a day (BID) | ORAL | Status: DC
  Administered 2015-07-02 – 2015-07-05 (×6): 100 mg via ORAL
  Filled 2015-07-02 (×5): qty 1

## 2015-07-02 MED ORDER — ONDANSETRON HCL 4 MG PO TABS: 4.0000 mg | ORAL_TABLET | Freq: Four times a day (QID) | ORAL | Status: DC | PRN

## 2015-07-02 MED ORDER — FLUTICASONE PROPIONATE 50 MCG/ACT NA SUSP
1.0000 | Freq: Every day | NASAL | Status: DC
  Administered 2015-07-05: 1 via NASAL
  Filled 2015-07-02: qty 16

## 2015-07-02 MED ORDER — TRANEXAMIC ACID 1000 MG/10ML IV SOLN
2000.0000 mg | INTRAVENOUS | Status: DC | PRN
  Administered 2015-07-02: 2000 mg via INTRAVENOUS

## 2015-07-02 MED ORDER — METOCLOPRAMIDE HCL 5 MG PO TABS: 5.0000 mg | ORAL_TABLET | Freq: Three times a day (TID) | ORAL | Status: DC | PRN

## 2015-07-02 MED ORDER — FLEET ENEMA 7-19 GM/118ML RE ENEM: 1.0000 | ENEMA | Freq: Once | RECTAL | Status: AC | PRN

## 2015-07-02 MED ORDER — ASPIRIN EC 325 MG PO TBEC: 325.0000 mg | DELAYED_RELEASE_TABLET | Freq: Two times a day (BID) | ORAL | Status: DC

## 2015-07-02 MED ORDER — INSULIN ASPART 100 UNIT/ML ~~LOC~~ SOLN
SUBCUTANEOUS | Status: AC
  Administered 2015-07-02: 100 [IU]
  Filled 2015-07-02: qty 11

## 2015-07-02 MED ORDER — CEFUROXIME SODIUM 1.5 G IJ SOLR
INTRAMUSCULAR | Status: AC
Start: 2015-07-02 — End: 2015-07-02
  Filled 2015-07-02: qty 1.5

## 2015-07-02 MED ORDER — OMEPRAZOLE MAGNESIUM 20 MG PO TBEC: 20.0000 mg | DELAYED_RELEASE_TABLET | Freq: Every day | ORAL | Status: DC

## 2015-07-02 MED ORDER — BUPIVACAINE-EPINEPHRINE (PF) 0.5% -1:200000 IJ SOLN
INTRAMUSCULAR | Status: AC
  Filled 2015-07-02: qty 30

## 2015-07-02 MED ORDER — METFORMIN HCL 500 MG PO TABS
1000.0000 mg | ORAL_TABLET | Freq: Two times a day (BID) | ORAL | Status: DC
  Administered 2015-07-02 – 2015-07-05 (×6): 1000 mg via ORAL
  Filled 2015-07-02 (×6): qty 2

## 2015-07-02 MED ORDER — ONDANSETRON HCL 4 MG/2ML IJ SOLN
4.0000 mg | Freq: Four times a day (QID) | INTRAMUSCULAR | Status: DC | PRN
  Administered 2015-07-02: 4 mg via INTRAVENOUS
  Filled 2015-07-02: qty 2

## 2015-07-02 MED ORDER — BISACODYL 5 MG PO TBEC
5.0000 mg | DELAYED_RELEASE_TABLET | Freq: Every day | ORAL | Status: DC | PRN
  Administered 2015-07-05: 5 mg via ORAL
  Filled 2015-07-02: qty 1

## 2015-07-02 SURGICAL SUPPLY — 59 items
BANDAGE ELASTIC 6 VELCRO ST LF (GAUZE/BANDAGES/DRESSINGS) ×3 IMPLANT
BANDAGE ESMARK 6X9 LF (GAUZE/BANDAGES/DRESSINGS) ×1 IMPLANT
BLADE SAG 18X100X1.27 (BLADE) ×3 IMPLANT
BLADE SAW SGTL 13X75X1.27 (BLADE) ×3 IMPLANT
BLADE SURG ROTATE 9660 (MISCELLANEOUS) IMPLANT
BNDG ELASTIC 6X10 VLCR STRL LF (GAUZE/BANDAGES/DRESSINGS) ×3 IMPLANT
BNDG ESMARK 6X9 LF (GAUZE/BANDAGES/DRESSINGS) ×3
BOWL SMART MIX CTS (DISPOSABLE) ×3 IMPLANT
CAPT KNEE TOTAL 3 ATTUNE ×3 IMPLANT
CEMENT HV SMART SET (Cement) ×6 IMPLANT
COVER SURGICAL LIGHT HANDLE (MISCELLANEOUS) ×3 IMPLANT
CUFF TOURNIQUET SINGLE 34IN LL (TOURNIQUET CUFF) ×3 IMPLANT
CUFF TOURNIQUET SINGLE 44IN (TOURNIQUET CUFF) IMPLANT
DRAPE EXTREMITY T 121X128X90 (DRAPE) ×3 IMPLANT
DRAPE U-SHAPE 47X51 STRL (DRAPES) ×3 IMPLANT
DURAPREP 26ML APPLICATOR (WOUND CARE) ×6 IMPLANT
ELECT REM PT RETURN 9FT ADLT (ELECTROSURGICAL) ×3
ELECTRODE REM PT RTRN 9FT ADLT (ELECTROSURGICAL) ×1 IMPLANT
EVACUATOR 1/8 PVC DRAIN (DRAIN) IMPLANT
GAUZE SPONGE 4X4 12PLY STRL (GAUZE/BANDAGES/DRESSINGS) ×3 IMPLANT
GAUZE XEROFORM 1X8 LF (GAUZE/BANDAGES/DRESSINGS) ×3 IMPLANT
GLOVE BIO SURGEON STRL SZ7.5 (GLOVE) ×3 IMPLANT
GLOVE BIO SURGEON STRL SZ8.5 (GLOVE) ×3 IMPLANT
GLOVE BIOGEL PI IND STRL 8 (GLOVE) ×1 IMPLANT
GLOVE BIOGEL PI IND STRL 9 (GLOVE) ×1 IMPLANT
GLOVE BIOGEL PI INDICATOR 8 (GLOVE) ×2
GLOVE BIOGEL PI INDICATOR 9 (GLOVE) ×2
GOWN STRL REUS W/ TWL LRG LVL3 (GOWN DISPOSABLE) ×1 IMPLANT
GOWN STRL REUS W/ TWL XL LVL3 (GOWN DISPOSABLE) ×2 IMPLANT
GOWN STRL REUS W/TWL LRG LVL3 (GOWN DISPOSABLE) ×2
GOWN STRL REUS W/TWL XL LVL3 (GOWN DISPOSABLE) ×4
HANDPIECE INTERPULSE COAX TIP (DISPOSABLE) ×2
HOOD PEEL AWAY FACE SHEILD DIS (HOOD) ×6 IMPLANT
KIT BASIN OR (CUSTOM PROCEDURE TRAY) ×3 IMPLANT
KIT ROOM TURNOVER OR (KITS) ×3 IMPLANT
MANIFOLD NEPTUNE II (INSTRUMENTS) ×3 IMPLANT
NEEDLE 22X1 1/2 (OR ONLY) (NEEDLE) ×3 IMPLANT
NEEDLE SPNL 18GX3.5 QUINCKE PK (NEEDLE) IMPLANT
NS IRRIG 1000ML POUR BTL (IV SOLUTION) ×3 IMPLANT
PACK TOTAL JOINT (CUSTOM PROCEDURE TRAY) ×3 IMPLANT
PACK UNIVERSAL I (CUSTOM PROCEDURE TRAY) ×3 IMPLANT
PAD ARMBOARD 7.5X6 YLW CONV (MISCELLANEOUS) ×6 IMPLANT
PADDING CAST COTTON 6X4 STRL (CAST SUPPLIES) ×3 IMPLANT
SET HNDPC FAN SPRY TIP SCT (DISPOSABLE) ×1 IMPLANT
SPONGE LAP 18X18 X RAY DECT (DISPOSABLE) ×3 IMPLANT
SUT VIC AB 0 CT1 27 (SUTURE) ×2
SUT VIC AB 0 CT1 27XBRD ANBCTR (SUTURE) ×1 IMPLANT
SUT VIC AB 1 CTX 36 (SUTURE) ×2
SUT VIC AB 1 CTX36XBRD ANBCTR (SUTURE) ×1 IMPLANT
SUT VIC AB 2-0 CT1 27 (SUTURE) ×2
SUT VIC AB 2-0 CT1 TAPERPNT 27 (SUTURE) ×1 IMPLANT
SUT VIC AB 3-0 CT1 27 (SUTURE)
SUT VIC AB 3-0 CT1 TAPERPNT 27 (SUTURE) IMPLANT
SUT VIC AB 3-0 FS2 27 (SUTURE) ×3 IMPLANT
SYR 50ML LL SCALE MARK (SYRINGE) ×3 IMPLANT
SYR CONTROL 10ML LL (SYRINGE) ×3 IMPLANT
TOWEL OR 17X24 6PK STRL BLUE (TOWEL DISPOSABLE) ×3 IMPLANT
TOWEL OR 17X26 10 PK STRL BLUE (TOWEL DISPOSABLE) ×3 IMPLANT
WATER STERILE IRR 1000ML POUR (IV SOLUTION) IMPLANT

## 2015-07-02 NOTE — Discharge Instructions (Signed)

## 2015-07-02 NOTE — Progress Notes (Signed)
Utilization review completed.  

## 2015-07-02 NOTE — H&P (View-Only) (Signed)
   06/21/15 1038  OBSTRUCTIVE SLEEP APNEA  Have you ever been diagnosed with sleep apnea through a sleep study? No  Do you snore loudly (loud enough to be heard through closed doors)?  0  Do you often feel tired, fatigued, or sleepy during the daytime? 1  Has anyone observed you stop breathing during your sleep? 0  Do you have, or are you being treated for high blood pressure? 1  BMI more than 35 kg/m2? 1  Age over 52 years old? 1  Neck circumference greater than 40 cm/16 inches? 1 (16.5)  Gender: 0

## 2015-07-02 NOTE — Interval H&P Note (Signed)
History and Physical Interval Note:  07/02/2015 7:11 AM  Lydia [name]  has presented today for surgery, with the diagnosis of LEFT KNEE OSTEOARTHRITIS  The various methods of treatment have been discussed with the patient and family. After consideration of risks, benefits and other options for treatment, the patient has consented to  Procedure(s) with comments: TOTAL KNEE ARTHROPLASTY (Left) - LEFT TOTAL KNEE ARTHROPLASTY DEPUY ATTUNE as a surgical intervention .  The patient's history has been reviewed, patient examined, no change in status, stable for surgery.  I have reviewed the patient's chart and labs.  Questions were answered to the patient's satisfaction.     [name]

## 2015-07-02 NOTE — Anesthesia Procedure Notes (Addendum)
Spinal Patient location during procedure: OR Start time: 07/02/2015 7:35 AM End time: 07/02/2015 7:43 AM Staffing Anesthesiologist: Annye Asa Performed by: anesthesiologist  Preanesthetic Checklist Completed: patient identified, site marked, surgical consent, pre-op evaluation, timeout performed, IV checked, risks and benefits discussed and monitors and equipment checked Spinal Block Patient position: sitting Prep: Betadine and ChloraPrep Patient monitoring: heart rate, cardiac monitor, continuous pulse ox and blood pressure Approach: midline Location: L3-4 Injection technique: single-shot Needle Needle type: Quincke  Needle gauge: 25 G Needle length: 9 cm Additional Notes Pt identified in Operating room.  Monitors applied. Working IV access confirmed. Sterile prep, drape lumbar spine. 1% lido local L3,4.  #25ga Quincke into clear CSF 1st pass.  12mg  Bupivacaine with dextrose injected with positive asp CSF at beginning and end of injection.  Patient asymptomatic, VSS, no heme aspirated, tolerated well.  Jenita Seashore, MD  Procedure Name: LMA Insertion Date/Time: 07/02/2015 8:05 AM Performed by: Vennie Homans Pre-anesthesia Checklist: Patient identified, Timeout performed, Emergency Drugs available, Suction available and Patient being monitored Patient Re-evaluated:Patient Re-evaluated prior to inductionOxygen Delivery Method: Circle system utilized Preoxygenation: Pre-oxygenation with 100% oxygen Intubation Type: IV induction Ventilation: Mask ventilation without difficulty LMA: LMA inserted LMA Size: 4.0 Number of attempts: 1 Placement Confirmation: ETT inserted through vocal cords under direct vision,  breath sounds checked- equal and bilateral and positive ETCO2 Tube secured with: Tape Dental Injury: Teeth and Oropharynx as per pre-operative assessment

## 2015-07-02 NOTE — Anesthesia Preprocedure Evaluation (Addendum)
Anesthesia Evaluation  Patient identified by MRN, date of birth, ID band Patient awake    Reviewed: Allergy & Precautions, NPO status , Patient's Chart, lab work & pertinent test results  History of Anesthesia Complications Negative for: history of anesthetic complications  Airway Mallampati: II  TM Distance: >3 FB Neck ROM: Full    Dental  (+) Dental Advisory Given   Pulmonary COPDCurrent Smoker,  breath sounds clear to auscultation        Cardiovascular hypertension, Pt. on medications Rhythm:Regular Rate:Normal  1/16 ECHO: EF 60-65%, valves OK   Neuro/Psych TIA   GI/Hepatic Neg liver ROS, GERD-  Medicated and Controlled,  Endo/Other  diabetes (glu 178), Oral Hypoglycemic AgentsMorbid obesityPolycystic ovarian  Renal/GU negative Renal ROS     Musculoskeletal   Abdominal (+) + obese,   Peds  Hematology   Anesthesia Other Findings   Reproductive/Obstetrics                            Anesthesia Physical Anesthesia Plan  ASA: III  Anesthesia Plan: Spinal   Post-op Pain Management:    Induction:   Airway Management Planned: Natural Airway and Simple Face Mask  Additional Equipment:   Intra-op Plan:   Post-operative Plan:   Informed Consent: I have reviewed the patients History and Physical, chart, labs and discussed the procedure including the risks, benefits and alternatives for the proposed anesthesia with the patient or authorized representative who has indicated his/her understanding and acceptance.   Dental advisory given  Plan Discussed with: CRNA and Surgeon  Anesthesia Plan Comments: (Plan routine monitors, SAB)        Anesthesia Quick Evaluation

## 2015-07-02 NOTE — Op Note (Signed)
PATIENT ID:      Lydia [name]  MRN:     [id number] DOB/AGE:    07/25/1961 / 54 y.o.       OPERATIVE REPORT    DATE OF PROCEDURE:  07/02/2015       PREOPERATIVE DIAGNOSIS:   LEFT KNEE OSTEOARTHRITIS      Estimated body mass index is 42.21 kg/(m^2) as calculated from the following:   Height as of 06/21/15: 5\' 4"  (1.626 m).   Weight as of this encounter: 111.585 kg (246 lb).                                                        POSTOPERATIVE DIAGNOSIS:   LEFT KNEE OSTEOARTHRITIS                                                                      PROCEDURE:  Procedure(s): TOTAL KNEE ARTHROPLASTY Using DepuyAttune RP implants #6L Femur, #5Tibia, 6 mm Attune RP bearing, 35 Patella     SURGEON: Hektor Huston J    ASSISTANT:   [employer] K[employer]   (Present and scrubbed throughout the case, critical for assistance with exposure, retraction, instrumentation, and closure.)         ANESTHESIA: GMLA, Spinal, Exparel  EBL: 400  FLUID REPLACEMENT: 1600 crystalloid  TOURNIQUET TIME: 87min  Drains: None  Tranexamic Acid: 2gm topical   COMPLICATIONS:  None         INDICATIONS FOR PROCEDURE: The patient has  LEFT KNEE OSTEOARTHRITIS, varus deformities, XR shows bone on bone arthritis. Patient has failed all conservative measures including anti-inflammatory medicines, narcotics, attempts at  exercise and weight loss, cortisone injections and viscosupplementation.  Risks and benefits of surgery have been discussed, questions answered.   DESCRIPTION OF PROCEDURE: The patient identified by armband, received  IV antibiotics, in the holding area at [facility]. Patient taken to the operating room, appropriate anesthetic  monitors were attached, and general endotracheal anesthesia induced with  the patient in supine position. Tourniquet  applied high to the operative thigh. Lateral post and foot positioner  applied to the table, the lower extremity was then prepped and draped  in usual sterile  fashion from the ankle to the tourniquet. Time-out procedure was performed. We began the operation, with the knee flexed 100 degrees, by making the anterior midline incision starting at handbreadth above the patella going over the patella 1 cm medial to and 4 cm distal to the tibial tubercle. Small bleeders in the skin and the  subcutaneous tissue identified and cauterized. Transverse retinaculum was incised and reflected medially and a medial parapatellar arthrotomy was accomplished. the patella was everted and theprepatellar fat pad resected. The superficial medial collateral  ligament was then elevated from anterior to posterior along the proximal  flare of the tibia and anterior half of the menisci resected. The knee was hyperflexed exposing bone on bone arthritis. Peripheral and notch osteophytes as well as the cruciate ligaments were then resected. We continued to  work our way around posteriorly along the proximal tibia, and externally  rotated the tibia subluxing it out from underneath the femur. A McHale  retractor was placed through the notch and a lateral Hohmann retractor  placed, and we then drilled through the proximal tibia in line with the  axis of the tibia followed by an intramedullary guide rod and 2-degree  posterior slope cutting guide. The tibial cutting guide, 3 degree posterior sloped, was pinned into place allowing resection of 4 mm of bone medially and about 10 mm of bone laterally. Satisfied with the tibial resection, we then  entered the distal femur 2 mm anterior to the PCL origin with the  intramedullary guide rod and applied the distal femoral cutting guide  set at 50mm, with 5 degrees of valgus. This was pinned along the  epicondylar axis. At this point, the distal femoral cut was accomplished without difficulty. We then sized for a #6L femoral component and pinned the guide in 3 degrees of external rotation.The chamfer cutting guide was pinned into place. The anterior,  posterior, and chamfer cuts were accomplished without difficulty followed by  the Attune RP box cutting guide and the box cut. We also removed posterior osteophytes from the posterior femoral condyles. At this  time, the knee was brought into full extension. We checked our  extension and flexion gaps and found them symmetric for a 6 mm bearing. Distracting in extension with a lamina spreader, the posterior horns of the menisci were removed, and Exparel, diluted to 60 cc, was injected into the capsule of the knee. The  posterior patella cut was accomplished with the 9.5 mm Attune cutting guide, sized at 35 dome, and the fixation pegs drilled.The knee  was then once again hyperflexed exposing the proximal tibia. We sized for a #5 tibial base plate, applied the smokestack and the conical reamer followed by the the Delta fin keel punch. We then hammered into place the Attune RP trial femoral component, inserted a  6 mm trial bearing, trial patellar button, and took the knee through range of motion from 0-130 degrees. No thumb pressure was required for patellar  Tracking. At this point, the limb was wrapped with an Esmarch bandage and the tourniquet inflated to 350 mmHg. All trial components were removed, mating surfaces irrigated with pulse lavage, and dried with suction and sponges. A double batch of DePuy HV cement with 1500 mg of Zinacef was mixed and applied to all bony metallic mating surfaces except for the posterior condyles of the femur itself. In order, we  hammered into place the tibial tray and removed excess cement, the femoral component and removed excess cement,  The 67mm  Attune RP bearing  was inserted, and the knee brought to full extension with compression.  The patellar button was clamped into place, and excess cement  removed. While the cement cured the wound was irrigated out with normal saline solution pulse lavage. Ligament stability and patellar tracking were checked and found to be  excellent. The parapatellar arthrotomy was closed with  running #1 Vicryl suture. The subcutaneous tissue with 0 and 2-0 undyed  Vicryl suture, and the skin with running 3-0 SQ vicryl. A dressing of Xeroform,  4 x 4, dressing sponges, Webril, and Ace wrap applied. The patient  awakened, extubated, and taken to recovery room without difficulty.   Suraya Vidrine J 07/02/2015, 9:28 AM

## 2015-07-02 NOTE — Progress Notes (Signed)
Orthopedic Tech Progress Note Patient Details:  Lydia [name] 02-21-1961 [id number]  CPM Left Knee CPM Left Knee: On Left Knee Flexion (Degrees): 40 Left Knee Extension (Degrees): 10 Additional Comments: Trapeze bar and foot roll   Irish Elders 07/02/2015, 11:06 AM

## 2015-07-02 NOTE — Anesthesia Postprocedure Evaluation (Signed)
  Anesthesia Post-op Note  Patient: Lydia [name]  Procedure(s) Performed: Procedure(s) with comments: TOTAL KNEE ARTHROPLASTY (Left) - LEFT TOTAL KNEE ARTHROPLASTY [place] ATTUNE  Patient Location: PACU  Anesthesia Type:General and Spinal  Level of Consciousness: awake, alert , oriented and patient cooperative  Airway and Oxygen Therapy: Patient Spontanous Breathing and Patient connected to nasal cannula oxygen  Post-op Pain: mild  Post-op Assessment: Post-op Vital signs reviewed, Patient's Cardiovascular Status Stable, Respiratory Function Stable, Patent Airway, No signs of Nausea or vomiting, Pain level controlled, No headache, Spinal receding and Patient able to bend at knees              Post-op Vital Signs: Reviewed and stable  Last Vitals:  Filed Vitals:   07/02/15 1105  BP: 156/96  Pulse:   Temp: 36.6 C  Resp:     Complications: No apparent anesthesia complications

## 2015-07-02 NOTE — Evaluation (Signed)
Physical Therapy Evaluation Patient Details Name: Lydia [name] MRN: [id number] DOB: [birth year] Today's Date: 07/02/2015   History of Present Illness  L TKA  Clinical Impression  Pt is s/p TKA resulting in the deficits listed below (see PT Problem List). Pt will benefit from skilled PT to increase their independence and safety with mobility to allow discharge to home. Unable to attempt standing due to patient complaints of pain and nausea.      Follow Up Recommendations Home health PT    Equipment Recommendations  Rolling walker with 5" wheels    Recommendations for Other Services       Precautions / Restrictions Precautions Precautions: Knee;Fall Restrictions Weight Bearing Restrictions: Yes LLE Weight Bearing: Weight bearing as tolerated      Mobility  Bed Mobility Overal bed mobility: Needs Assistance Bed Mobility: Supine to Sit;Sit to Supine     Supine to sit: Mod assist Sit to supine: Mod assist   General bed mobility comments: assist primarily with LLE, c/o nausea while sitting, better with return to bed.   Transfers                    Ambulation/Gait                Stairs            Wheelchair Mobility    Modified Rankin (Stroke Patients Only)       Balance Overall balance assessment: Needs assistance Sitting-balance support: Bilateral upper extremity supported Sitting balance-Leahy Scale: Poor                                       Pertinent Vitals/Pain Pain Assessment: 0-10 Pain Score: 9  Pain Location: Lt knee Pain Descriptors / Indicators: Other (Comment) (Pain) Pain Intervention(s): Limited activity within patient's tolerance;Monitored during session;Repositioned;Ice applied    Home Living Family/patient expects to be discharged to:: Private residence Living Arrangements: Spouse/significant other;Children Available Help at Discharge: Family Type of Home: House Home Access: Level entry     Home  Layout: One level Home Equipment: Cane - single point      Prior Function Level of Independence: Independent         Comments: reports slow mobility previously     Hand Dominance        Extremity/Trunk Assessment               Lower Extremity Assessment: LLE deficits/detail;Overall WFL for tasks assessed   LLE Deficits / Details: Unable to perform SLR     Communication   Communication: No difficulties  Cognition Arousal/Alertness: Lethargic Behavior During Therapy: Anxious Overall Cognitive Status: Within Functional Limits for tasks assessed                      General Comments      Exercises        Assessment/Plan    PT Assessment Patient needs continued PT services  PT Diagnosis Difficulty walking;Acute pain   PT Problem List Decreased strength;Decreased range of motion;Decreased activity tolerance;Decreased balance;Decreased mobility;Decreased knowledge of use of DME  PT Treatment Interventions DME instruction;Gait training;Stair training;Functional mobility training;Therapeutic activities;Therapeutic exercise;Balance training;Patient/family education   PT Goals (Current goals can be found in the Care Plan section) Acute Rehab PT Goals Patient Stated Goal: move and walk again PT Goal Formulation: With patient/family Time For Goal Achievement: 07/16/15 Potential to Achieve Goals:  Good    Frequency 7X/week   Barriers to discharge        Co-evaluation               End of Session   Activity Tolerance: Patient limited by pain;Other (comment);Patient limited by lethargy (and nausea) Patient left: in bed;with call bell/phone within reach;with family/visitor present;with SCD's reapplied (in bone foam)           Time: 1937-9024 PT Time Calculation (min) (ACUTE ONLY): 31 min   Charges:   PT Evaluation $Initial PT Evaluation Tier I: 1 Procedure PT Treatments $Therapeutic Activity: 8-22 mins   PT G Codes:        Cassell Clement, PT, CSCS Pager [phone] Office [phone]  07/02/2015, 3:07 PM

## 2015-07-02 NOTE — Transfer of Care (Signed)
Immediate Anesthesia Transfer of Care Note  Patient: Lydia [name]  Procedure(s) Performed: Procedure(s) with comments: TOTAL KNEE ARTHROPLASTY (Left) - LEFT TOTAL KNEE ARTHROPLASTY [place] ATTUNE  Patient Location: PACU  Anesthesia Type:Spinal  Level of Consciousness: awake, alert , oriented, patient cooperative and responds to stimulation  Airway & Oxygen Therapy: Patient Spontanous Breathing and Patient connected to face mask oxygen  Post-op Assessment: Report given to RN, Post -op Vital signs reviewed and stable and Patient able to stick tongue midline  Post vital signs: stable  Last Vitals:  Filed Vitals:   07/02/15 0638  BP: 190/104  Pulse: 76  Temp: 36.9 C  Resp: 20    Complications: No apparent anesthesia complications

## 2015-07-03 ENCOUNTER — Encounter (HOSPITAL_COMMUNITY): Payer: Self-pay | Admitting: Orthopedic Surgery

## 2015-07-03 LAB — CBC
HEMATOCRIT: 35.8 % — AB (ref 36.0–46.0)
HEMOGLOBIN: 12.1 g/dL (ref 12.0–15.0)
MCH: 28.9 pg (ref 26.0–34.0)
MCHC: 33.8 g/dL (ref 30.0–36.0)
MCV: 85.6 fL (ref 78.0–100.0)
Platelets: 236 10*3/uL (ref 150–400)
RBC: 4.18 MIL/uL (ref 3.87–5.11)
RDW: 14.3 % (ref 11.5–15.5)
WBC: 13.7 10*3/uL — AB (ref 4.0–10.5)

## 2015-07-03 LAB — GLUCOSE, CAPILLARY
GLUCOSE-CAPILLARY: 179 mg/dL — AB (ref 65–99)
Glucose-Capillary: 212 mg/dL — ABNORMAL HIGH (ref 65–99)
Glucose-Capillary: 283 mg/dL — ABNORMAL HIGH (ref 65–99)
Glucose-Capillary: 197 mg/dL — ABNORMAL HIGH (ref 65–99)

## 2015-07-03 LAB — BASIC METABOLIC PANEL
ANION GAP: 8 (ref 5–15)
BUN: <5 mg/dL — ABNORMAL LOW (ref 6–20)
CO2: 29 mmol/L (ref 22–32)
CREATININE: 0.65 mg/dL (ref 0.44–1.00)
Calcium: 8.2 mg/dL — ABNORMAL LOW (ref 8.9–10.3)
Chloride: 93 mmol/L — ABNORMAL LOW (ref 101–111)
GFR calc Af Amer: >60 mL/min (ref >60)
Glucose, Bld: 236 mg/dL — ABNORMAL HIGH (ref 65–99)
POTASSIUM: 3.7 mmol/L (ref 3.5–5.1)
SODIUM: 130 mmol/L — AB (ref 135–145)

## 2015-07-03 LAB — HEMOGLOBIN A1C
Hgb A1c MFr Bld: 7.6 % — ABNORMAL HIGH (ref 4.8–5.6)
Mean Plasma Glucose: 171 mg/dL

## 2015-07-03 NOTE — Progress Notes (Signed)
Physical Therapy Treatment Patient Details Name: Lydia [name] MRN: [id number] DOB: 01/23/1961 Today's Date: 07/03/2015    History of Present Illness L TKA    PT Comments    Pt progressing well with ambulation. Pt is in need of encouragement throughout treatment. Frequently states "I can't do it" throughout session. With encouragement and motivation pt will perform the task at hand and will distribute gratitude for helping her through. Continue POC.  Follow Up Recommendations  Home health PT     Equipment Recommendations  Rolling walker with 5" wheels    Recommendations for Other Services       Precautions / Restrictions Precautions Precautions: Knee;Fall Precaution Comments: Reviewed no pillow under knee and zero foam Restrictions LLE Weight Bearing: Weight bearing as tolerated    Mobility  Bed Mobility Overal bed mobility: Needs Assistance Bed Mobility: Supine to Sit Rolling: Min assist Sidelying to sit: Mod assist Supine to sit: Mod assist Sit to supine: Mod assist   General bed mobility comments: Mod A for help with getting LEs onto bed. Cues for safety and sequence.  Transfers Overall transfer level: Needs assistance Equipment used: Rolling walker (2 wheeled) Transfers: Sit to/from Stand Sit to Stand: Min assist         General transfer comment: Cues for hand placement, safety, and sequencing.  Ambulation/Gait Ambulation/Gait assistance: Min assist Ambulation Distance (Feet): 70 Feet Assistive device: Rolling walker (2 wheeled) Gait Pattern/deviations: Step-to pattern;Step-through pattern;Decreased step length - right;Decreased stance time - left;Trunk flexed   Gait velocity interpretation: Below normal speed for age/gender General Gait Details: Cues for upright posture and safetywith RW. Needed frequent standing rest breaks while walking.   Stairs            Wheelchair Mobility    Modified Rankin (Stroke Patients Only)       Balance  Overall balance assessment: Needs assistance Sitting-balance support: No upper extremity supported;Feet supported Sitting balance-Leahy Scale: Fair     Standing balance support: Bilateral upper extremity supported;During functional activity Standing balance-Leahy Scale: Poor                      Cognition Arousal/Alertness: Awake/alert Behavior During Therapy: WFL for tasks assessed/performed Overall Cognitive Status: Within Functional Limits for tasks assessed                      Exercises Total Joint Exercises Quad Sets: Strengthening;Left;10 reps;Supine Heel Slides: AAROM;Left;10 reps;Supine Hip ABduction/ADduction: AAROM;Left;10 reps;Supine Straight Leg Raises: AAROM;Left;5 reps;Supine    General Comments        Pertinent Vitals/Pain Pain Assessment: 0-10 Pain Score: 7  Pain Location: L knee Pain Descriptors / Indicators: Aching;Grimacing;Operative site guarding Pain Intervention(s): Limited activity within patient's tolerance;Monitored during session;Repositioned    Home Living                      Prior Function            PT Goals (current goals can now be found in the care plan section) Acute Rehab PT Goals Patient Stated Goal: feel better Progress towards PT goals: Progressing toward goals    Frequency  7X/week    PT Plan Current plan remains appropriate    Co-evaluation             End of Session Equipment Utilized During Treatment: Gait belt Activity Tolerance: Patient limited by pain Patient left: in bed;with call bell/phone within reach;with family/visitor present  Time: 5809-9833 PT Time Calculation (min) (ACUTE ONLY): 45 min  Charges:                       G CodesAllyn Kenner, SPTA Jul 31, 2015, 12:58 PM

## 2015-07-03 NOTE — Progress Notes (Signed)
Occupational Therapy Evaluation Patient Details Name: Lydia [name] MRN: [id number] DOB: [birth year] Today's Date: 07/03/2015    History of Present Illness L TKA   Clinical Impression   Patient presenting with deconditioning, increased anxiety, increased pain, and decreased ADL/IADL independence. Patient independent, but slow per her report PTA. Patient currently functioning at an overall mod assist level. Patient will benefit from acute OT to increase overall independence in the areas of ADLs, functional mobility, and overall safety in order to safely discharge home with [facility] and 24/7 supervision.     Follow Up Recommendations  Home health OT;Supervision/Assistance - 24 hour    Equipment Recommendations  3 in 1 bedside comode    Recommendations for Other Services  None at this time   Precautions / Restrictions Precautions Precautions: Knee;Fall Precaution Comments: Reviewed no pillow under knee and zero foam Restrictions Weight Bearing Restrictions: Yes LLE Weight Bearing: Weight bearing as tolerated    Mobility Bed Mobility Overal bed mobility: Needs Assistance Bed Mobility: Rolling;Sidelying to Sit Rolling: Min assist Sidelying to sit: Mod assist General bed mobility comments: mod assist for management of BLEs, cues for safety and sequencing   Transfers Overall transfer level: Needs assistance Equipment used: Rolling walker (2 wheeled) Transfers: Sit to/from Stand Sit to Stand: Mod assist General transfer comment: Cues for hand placement, safety, and sequencing     Balance Overall balance assessment: Needs assistance Sitting-balance support: No upper extremity supported;Feet supported Sitting balance-Leahy Scale: Fair     Standing balance support: Bilateral upper extremity supported;During functional activity Standing balance-Leahy Scale: Poor    ADL Overall ADL's : Needs assistance/impaired Eating/Feeding: Set up;Sitting   Grooming: Set up;Sitting    Upper Body Bathing: Set up;Sitting   Lower Body Bathing: Moderate assistance;Sit to/from stand   Upper Body Dressing : Set up;Sitting   Lower Body Dressing: Moderate assistance;Sit to/from stand   Toilet Transfer: Moderate assistance;RW;BSC Functional mobility during ADLs: Moderate assistance;Rolling walker General ADL Comments: Patient limited by increased anxiety and pain. Pt unable to reach BLEs. Pt engaged in bed mobility with mod assist, sitting EOB with increased pain. Pt then ambulated from EOB-> recliner with mod assist, mod assist secondary to pain & anxiety. During ambulation, pt with complaints of her arms feeling like jello and with difficulty breathing also stating she wouldn't make it to the chair. Therapist provided encouragement. Pt's mother present and provided support prn. Encouraged pt to call for assistance and encouraged her to use [facility] beside recliner instead of attempting to ambulate for her safety.     Pertinent Vitals/Pain Pain Assessment: 0-10 Pain Score: 10-Worst pain ever Pain Location: Left Knee Pain Descriptors / Indicators: Sharp Pain Intervention(s): Limited activity within patient's tolerance;Monitored during session;Repositioned     Hand Dominance Right   Extremity/Trunk Assessment Upper Extremity Assessment Upper Extremity Assessment: Generalized weakness (Pt with comments, "my arms feel like jello" when using RW for short distance ambulation)   Lower Extremity Assessment Lower Extremity Assessment: Defer to PT evaluation   Cervical / Trunk Assessment Cervical / Trunk Assessment: Normal   Communication Communication Communication: No difficulties   Cognition Arousal/Alertness: Lethargic;Suspect due to medications Behavior During Therapy: Anxious Overall Cognitive Status: Within Functional Limits for tasks assessed                Home Living Family/patient expects to be discharged to:: Private residence Living Arrangements:  Spouse/significant other;Children Available Help at Discharge: Family Type of Home: House Home Access: Level entry     Home Layout: One level  Bathroom Shower/Tub: Corporate investment banker: Standard     Home Equipment: Cane - single point   Prior Functioning/Environment Level of Independence: Independent        Comments: reports slow mobility previously    OT Diagnosis: Generalized weakness;Acute pain   OT Problem List: Decreased strength;Decreased range of motion;Decreased activity tolerance;Impaired balance (sitting and/or standing);Decreased safety awareness;Decreased knowledge of use of DME or AE;Pain   OT Treatment/Interventions: Self-care/ADL training;Therapeutic exercise;Energy conservation;DME and/or AE instruction;Therapeutic activities;Patient/family education;Balance training    OT Goals(Current goals can be found in the care plan section) Acute Rehab OT Goals Patient Stated Goal: feel better OT Goal Formulation: With patient/family Time For Goal Achievement: 07/17/15 Potential to Achieve Goals: Good ADL Goals Pt Will Perform Grooming: with modified independence;standing Pt Will Perform Lower Body Bathing: with modified independence;sit to/from stand;with adaptive equipment Pt Will Perform Lower Body Dressing: with modified independence;sit to/from stand;with adaptive equipment Pt Will Transfer to Toilet: with modified independence;ambulating;bedside commode Pt Will Perform Toileting - Clothing Manipulation and hygiene: with modified independence;sit to/from stand Pt Will Perform Tub/Shower Transfer: Tub transfer;3 in 1;rolling walker;ambulating;with modified independence Additional ADL Goal #1: Pt will engage in bed mobility with supervision in preparation for ADLs and functional mobility/transfers  OT Frequency: Min 2X/week   Barriers to D/C: Patient's mother states she will be available for ~1 week post acute d/c   End of Session  Equipment Utilized During Treatment: Gait belt;Rolling walker CPM Left Knee CPM Left Knee: Off Nurse Communication: Other (comment);Mobility status (NT notified: recommendation of using BSC for toileting needs)  Activity Tolerance: Patient limited by pain;Other (comment) (and limited by anxiety) Patient left: in chair;with call bell/phone within reach;with family/visitor present   Time: 1497-0263 OT Time Calculation (min): 30 min Charges:  OT General Charges $OT Visit: 1 Procedure OT Evaluation $Initial OT Evaluation Tier I: 1 Procedure OT Treatments $Self Care/Home Management : 8-22 mins  Charlot Gouin , MS, OTR/L, CLT Pager: [phone]  07/03/2015, 9:17 AM

## 2015-07-03 NOTE — Care Management Note (Signed)
Case Management Note  Patient Details  Name: MYA SUELL MRN: [id number] Date of Birth: 1961/02/21  Subjective/Objective:           S/p left total knee arthroplasty         Action/Plan: Set up with [facility] for HHPT by MD office. Spoke with patient, no change in discharge plan. Patient states that she will have family to assist after discharge. [employer] to deliver rolling walker and 3N1. Will follow until discharge.    Expected Discharge Date:                  Expected Discharge Plan:  [place]  In-House Referral:  NA  Discharge planning Services  CM Consult  Post Acute Care Choice:  Durable Medical Equipment, Home Health Choice offered to:  Patient  DME Arranged:  3-N-1, Walker rolling DME Agency:  [employer]  HH Arranged:  PT [place] Agency:  [place]  Status of Service:  Completed, signed off  Medicare Important Message Given:    Date Medicare IM Given:    Medicare IM give by:    Date Additional Medicare IM Given:    Additional Medicare Important Message give by:     If discussed at [place] of Stay Meetings, dates discussed:    Additional Comments:  Nila Nephew, RN 07/03/2015, 4:13 PM

## 2015-07-03 NOTE — Progress Notes (Signed)
Physical Therapy Treatment Patient Details Name: Lydia [name] MRN: [id number] DOB: December 12, 1960 Today's Date: 07/03/2015    History of Present Illness L TKA    PT Comments    Pt is motivated to get better. But often times lacks confidence in herself. Needs encouragement. Cues needed during ambulation for safety with RW. Continue to recommend HHPT for ongoing rehab to increase functional independence. Continue with current POC.    Follow Up Recommendations  Home health PT     Equipment Recommendations  Rolling walker with 5" wheels    Recommendations for Other Services       Precautions / Restrictions Precautions Precautions: Knee;Fall Precaution Comments: Reviewed no pillow under knee and zero foam Restrictions LLE Weight Bearing: Weight bearing as tolerated    Mobility  Bed Mobility Overal bed mobility: Needs Assistance Bed Mobility: Supine to Sit;Sit to Supine     Supine to sit: Mod assist Sit to supine: Mod assist   General bed mobility comments: Mod A for help with getting LEs onto bed. Cues for safety and sequence.  Transfers Overall transfer level: Needs assistance Equipment used: Rolling walker (2 wheeled) Transfers: Sit to/from Stand Sit to Stand: Min assist         General transfer comment: Cues for hand placement, safety, and sequencing.  Ambulation/Gait Ambulation/Gait assistance: Min assist Ambulation Distance (Feet): 70 Feet Assistive device: Rolling walker (2 wheeled) Gait Pattern/deviations: Step-through pattern;Decreased step length - right;Decreased stance time - left;Decreased weight shift to left;Antalgic;Trunk flexed   Gait velocity interpretation: Below normal speed for age/gender General Gait Details: Pt needed cues for upright posture. Needed frequent rest breaks during ambulation. Pt will lean on RW through forearms. Instructed pt about safety and possibiltiy of walker sliding out from under her.   Stairs             Wheelchair Mobility    Modified Rankin (Stroke Patients Only)       Balance                                    Cognition Arousal/Alertness: Awake/alert Behavior During Therapy: WFL for tasks assessed/performed Overall Cognitive Status: Within Functional Limits for tasks assessed                      Exercises Total Joint Exercises Quad Sets: Strengthening;Left;10 reps;Supine Heel Slides: AAROM;Left;10 reps;Supine Hip ABduction/ADduction: AAROM;Left;10 reps;Supine Straight Leg Raises: AAROM;Left;5 reps;Supine    General Comments        Pertinent Vitals/Pain Pain Assessment: 0-10 Pain Score: 6  Pain Location: L knee Pain Descriptors / Indicators: Aching;Grimacing;Operative site guarding Pain Intervention(s): Monitored during session;Limited activity within patient's tolerance;Repositioned    Home Living                      Prior Function            PT Goals (current goals can now be found in the care plan section) Progress towards PT goals: Progressing toward goals    Frequency  7X/week    PT Plan Current plan remains appropriate    Co-evaluation             End of Session Equipment Utilized During Treatment: Gait belt Activity Tolerance: Patient limited by pain Patient left: in bed;with call bell/phone within reach;with family/visitor present     Time: 2841-3244 PT Time Calculation (min) (ACUTE ONLY): 42 min  Charges:  $Gait Training: 23-37 mins $Therapeutic Exercise: 8-22 mins                    G Codes:      Allyn Kenner, SPTA 07-23-15, 3:39 PM

## 2015-07-04 LAB — CBC
HEMATOCRIT: 34 % — AB (ref 36.0–46.0)
Hemoglobin: 11.5 g/dL — ABNORMAL LOW (ref 12.0–15.0)
MCH: 29 pg (ref 26.0–34.0)
MCHC: 33.8 g/dL (ref 30.0–36.0)
MCV: 85.9 fL (ref 78.0–100.0)
Platelets: 252 10*3/uL (ref 150–400)
RBC: 3.96 MIL/uL (ref 3.87–5.11)
RDW: 14.4 % (ref 11.5–15.5)
WBC: 14.1 10*3/uL — AB (ref 4.0–10.5)

## 2015-07-04 LAB — GLUCOSE, CAPILLARY
GLUCOSE-CAPILLARY: 273 mg/dL — AB (ref 65–99)
GLUCOSE-CAPILLARY: 179 mg/dL — AB (ref 65–99)
Glucose-Capillary: 176 mg/dL — ABNORMAL HIGH (ref 65–99)
Glucose-Capillary: 181 mg/dL — ABNORMAL HIGH (ref 65–99)

## 2015-07-04 NOTE — Progress Notes (Signed)
Physical Therapy Treatment Patient Details Name: Lydia [name] MRN: [id number] DOB: 02/10/1961 Today's Date: 07/04/2015    History of Present Illness L TKA    PT Comments    Patient is showing some progressing but fatigues quickly with any ambulation. Will see again this afternoon. Patient does not feel safe to DC today. Will assess this afternoon.   Follow Up Recommendations  Home health PT     Equipment Recommendations  Rolling walker with 5" wheels    Recommendations for Other Services       Precautions / Restrictions Precautions Precautions: Knee;Fall Precaution Comments: Reviewed no pillow under knee and zero foam Restrictions LLE Weight Bearing: Weight bearing as tolerated    Mobility  Bed Mobility Overal bed mobility: Needs Assistance         Sit to supine: Supervision   General bed mobility comments: CUes for use of sheet to lift LE back into bed without outside physical assist per patient request  Transfers Overall transfer level: Needs assistance Equipment used: Rolling walker (2 wheeled)   Sit to Stand: Min guard         General transfer comment: Cues for hand placement, safety, and sequencing.  Ambulation/Gait Ambulation/Gait assistance: Min guard Ambulation Distance (Feet): 70 Feet Assistive device: Rolling walker (2 wheeled) Gait Pattern/deviations: Step-through pattern;Decreased stride length;Decreased stance time - left;Decreased step length - right   Gait velocity interpretation: Below normal speed for age/gender General Gait Details: Patient required seated rest break. Fatigues quickly. Complains of UE pain due to increased weight through UEs vs. LEs   Stairs            Wheelchair Mobility    Modified Rankin (Stroke Patients Only)       Balance                                    Cognition Arousal/Alertness: Awake/alert Behavior During Therapy: WFL for tasks assessed/performed Overall Cognitive Status:  Within Functional Limits for tasks assessed                      Exercises Total Joint Exercises Quad Sets: Strengthening;Left;10 reps;Supine Heel Slides: AAROM;Left;10 reps;Supine Hip ABduction/ADduction: AAROM;Left;10 reps;Supine Straight Leg Raises: AAROM;Left;Supine;10 reps    General Comments        Pertinent Vitals/Pain Pain Score: 6  Pain Location: L knee Pain Descriptors / Indicators: Aching;Sore Pain Intervention(s): Monitored during session    Home Living                      Prior Function            PT Goals (current goals can now be found in the care plan section) Progress towards PT goals: Progressing toward goals    Frequency  7X/week    PT Plan Current plan remains appropriate    Co-evaluation             End of Session   Activity Tolerance: Patient limited by fatigue Patient left: in chair;with call bell/phone within reach     Time: 1607-3710 PT Time Calculation (min) (ACUTE ONLY): 26 min  Charges:  $Gait Training: 8-22 mins $Therapeutic Exercise: 8-22 mins                    G Codes:      Jacqualyn Posey 07/04/2015, 9:22 AM 07/04/2015 Jacqualyn Posey PTA [phone]  pager [phone] office

## 2015-07-04 NOTE — Discharge Summary (Addendum)
Patient ID: Lydia [name] MRN: [id number] DOB/AGE: 1961-03-09 54 y.o.  Admit date: 07/02/2015 Discharge date: 07/04/2015  Admission Diagnoses:  Principal Problem:   Primary osteoarthritis of left knee Active Problems:   Arthritis of knee   Discharge Diagnoses:  Same, Morbid Obesity  Past Medical History  Diagnosis Date  . PCOS (polycystic ovarian syndrome)   . Hypertension   . Osteoarthritis   . Family history of adverse reaction to anesthesia     5 YR SON  HAD HERNIA REPAIR AND GOT NAUSEATED AFTER  . Asthma     SEASONAL  ASTHMA,  COMES ON WITH RESPIRATORY INFECTIONS.  Marland Kitchen Anxiety   . Kidney stone on right side     Admission May 2016   . TIA (transient ischemic attack)     March 2015, 2013  . Pneumonia     several times  . Diabetes mellitus     Type 2  . GERD (gastroesophageal reflux disease)     Surgeries: Procedure(s): TOTAL KNEE ARTHROPLASTY on 07/02/2015   Consultants:    Discharged Condition: Improved  Hospital Course: Lydia [name] is an 54 y.o. female who was admitted 07/02/2015 for operative treatment ofPrimary osteoarthritis of left knee. Patient has severe unremitting pain that affects sleep, daily activities, and work/hobbies. After pre-op clearance the patient was taken to the operating room on 07/02/2015 and underwent  Procedure(s): TOTAL KNEE ARTHROPLASTY.    Patient was given perioperative antibiotics: Anti-infectives    Start     Dose/Rate Route Frequency Ordered Stop   07/02/15 0812  cefUROXime (ZINACEF) injection  Status:  Discontinued       As needed 07/02/15 0812 07/02/15 1000   07/02/15 0615  ceFAZolin (ANCEF) IVPB 2 g/50 mL premix     2 g 100 mL/hr over 30 Minutes Intravenous To ShortStay Surgical 07/02/15 0608 07/02/15 0733   07/02/15 0610  ceFAZolin (ANCEF) 2-3 GM-% IVPB SOLR    Comments:  Henrine Screws   : cabinet override      07/02/15 0610 07/02/15 1814       Patient was given sequential compression devices, early ambulation, and  chemoprophylaxis to prevent DVT.  Patient benefited maximally from hospital stay and there were no complications.    Recent vital signs: Patient Vitals for the past 24 hrs:  BP Temp Temp src Pulse Resp SpO2  07/04/15 0612 139/72 mmHg 98.5 F (36.9 C) Oral (!) 109 18 95 %  07/03/15 2049 (!) 147/77 mmHg 99.5 F (37.5 C) Oral (!) 111 17 92 %  07/03/15 1553 (!) 159/82 mmHg 98.3 F (36.8 C) Oral (!) 105 18 98 %  07/03/15 1013 132/88 mmHg - - - - -     Recent laboratory studies:  Recent Labs  07/03/15 0600 07/04/15 0415  WBC 13.7* 14.1*  HGB 12.1 11.5*  HCT 35.8* 34.0*  PLT 236 252  NA 130*  --   K 3.7  --   CL 93*  --   CO2 29  --   BUN <5*  --   CREATININE 0.65  --   GLUCOSE 236*  --   CALCIUM 8.2*  --      Discharge Medications:     Medication List    STOP taking these medications        canagliflozin 300 MG Tabs tablet  Commonly known as:  INVOKANA      TAKE these medications        albuterol 108 (90 BASE) MCG/ACT inhaler  Commonly known as:  PROVENTIL HFA;VENTOLIN HFA  Inhale 2 puffs into the lungs every 6 (six) hours as needed for wheezing or shortness of breath.     aspirin 81 MG EC tablet  Take 1 tablet (81 mg total) by mouth daily.     aspirin EC 325 MG tablet  Take 1 tablet (325 mg total) by mouth 2 (two) times daily.     atorvastatin 10 MG tablet  Commonly known as:  LIPITOR  Take 10 mg by mouth at bedtime.     diclofenac 75 MG EC tablet  Commonly known as:  VOLTAREN  Take 75 mg by mouth 2 (two) times daily.     DULoxetine 60 MG capsule  Commonly known as:  CYMBALTA  Take 120 mg by mouth daily with breakfast.     fluticasone 50 MCG/ACT nasal spray  Commonly known as:  FLONASE  Place 1 spray into both nostrils daily.     levonorgestrel 20 MCG/24HR IUD  Commonly known as:  MIRENA  1 each by Intrauterine route once. Implanted fall 2013     lisinopril-hydrochlorothiazide 20-25 MG per tablet  Commonly known as:  PRINZIDE,ZESTORETIC  Take  0.5 tablets by mouth daily.     metFORMIN 1000 MG tablet  Commonly known as:  GLUCOPHAGE  Take 1,000 mg by mouth 2 (two) times daily.     methocarbamol 500 MG tablet  Commonly known as:  ROBAXIN  Take 1 tablet (500 mg total) by mouth 2 (two) times daily with a meal.     omeprazole 20 MG tablet  Commonly known as:  PRILOSEC OTC  Take 20 mg by mouth at bedtime.     oxyCODONE-acetaminophen 5-325 MG per tablet  Commonly known as:  ROXICET  Take 1 tablet by mouth every 4 (four) hours as needed.     PROBIOTIC PO  Take 1 capsule by mouth at bedtime.     sitaGLIPtin 100 MG tablet  Commonly known as:  JANUVIA  Take 100 mg by mouth daily.        Diagnostic Studies: No results found.  Disposition: 01-Home or Self Care      Discharge Instructions    CPM    Complete by:  As directed   Continuous passive motion machine (CPM):      Use the CPM from 0 to 60  for 5 hours per day.      You may increase by 10 degrees per day.  You may break it up into 2 or 3 sessions per day.      Use CPM for 2 weeks or until you are told to stop.     Call MD / Call 911    Complete by:  As directed   If you experience chest pain or shortness of breath, CALL 911 and be transported to the hospital emergency room.  If you develope a fever above 101 F, pus (white drainage) or increased drainage or redness at the wound, or calf pain, call your surgeon's office.     Change dressing    Complete by:  As directed   Change dressing on day 5, then change the dressing daily with sterile 4 x 4 inch gauze dressing and apply TED hose.  You may clean the incision with alcohol prior to redressing.     Constipation Prevention    Complete by:  As directed   Drink plenty of fluids.  Prune juice may be helpful.  You may use a stool softener, such as Colace (over the counter)  100 mg twice a day.  Use MiraLax (over the counter) for constipation as needed.     Diet - low sodium heart healthy    Complete by:  As directed       Driving restrictions    Complete by:  As directed   No driving for 2 weeks     Increase activity slowly as tolerated    Complete by:  As directed      Patient may shower    Complete by:  As directed   You may shower without a dressing once there is no drainage.  Do not wash over the wound.  If drainage remains, cover wound with plastic wrap and then shower.           Follow-up Information    Follow up with [name], MD In 2 weeks.   Specialty:  Orthopedic Surgery   Contact information:   [place] [address] [phone]       Follow up with [facility].   Why:  They will contact you to schedule home therapy visits.   Contact information:   [address] [place] [address] [phone]        Signed: Hardin Negus, ERIC R 07/04/2015, 8:02 AM

## 2015-07-04 NOTE — Progress Notes (Addendum)
PATIENT ID: Lydia [name]  MRN: [id number]  DOB/AGE:  Jun 04, 1961 / 54 y.o.  2 Days Post-Op Procedure(s) (LRB): TOTAL KNEE ARTHROPLASTY (Left)    PROGRESS NOTE Subjective: Patient is alert, oriented, no Nausea, no Vomiting, pt not sure if she is passing gas, no Bowel Movement. Taking PO well. Denies SOB, Chest or Calf Pain. Using Incentive Spirometer, PAS in place. Ambulate WBAT, CPM 0-40 Patient reports pain as 3 on 0-10 scale  .    Objective: Vital signs in last 24 hours: Filed Vitals:   07/03/15 1013 07/03/15 1553 07/03/15 2049 07/04/15 0612  BP: 132/88 159/82 147/77 139/72  Pulse:  105 111 109  Temp:  98.3 F (36.8 C) 99.5 F (37.5 C) 98.5 F (36.9 C)  TempSrc:  Oral Oral Oral  Resp:  18 17 18   Height:      Weight:      SpO2:  98% 92% 95%      Intake/Output from previous day: I/O last 3 completed shifts: In: 700 [P.O.:700] Out: 2000 [Urine:2000]   Intake/Output this shift:     LABORATORY DATA:  Recent Labs  07/03/15 0600  07/03/15 1719 07/03/15 2151 07/04/15 0415 07/04/15 0630  WBC 13.7*  --   --   --  14.1*  --   HGB 12.1  --   --   --  11.5*  --   HCT 35.8*  --   --   --  34.0*  --   PLT 236  --   --   --  252  --   NA 130*  --   --   --   --   --   K 3.7  --   --   --   --   --   CL 93*  --   --   --   --   --   CO2 29  --   --   --   --   --   BUN <5*  --   --   --   --   --   CREATININE 0.65  --   --   --   --   --   GLUCOSE 236*  --   --   --   --   --   GLUCAP  --   < > 197* 179*  --  273*  CALCIUM 8.2*  --   --   --   --   --   < > = values in this interval not displayed.  Examination: Neurologically intact Neurovascular intact Sensation intact distally Intact pulses distally Dorsiflexion/Plantar flexion intact Incision: dressing C/D/I No cellulitis present Compartment soft}  Assessment:   2 Days Post-Op Procedure(s) (LRB): TOTAL KNEE ARTHROPLASTY (Left) ADDITIONAL DIAGNOSIS: Expected Acute Blood Loss Anemia, Diabetes, Hypertension  and Hx of TIA, Morbid Obesity  Plan: PT/OT WBAT, CPM 5/hrs day until ROM 0-90 degrees, then D/C CPM DVT Prophylaxis:  SCDx72hrs, ASA 325 mg BID x 2 weeks DISCHARGE PLAN: Home when pt meets therapy goals. DISCHARGE NEEDS: HHPT, HHRN, CPM, Walker and 3-in-1 comode seat     PHILLIPS, ERIC R 07/04/2015, 7:57 AM

## 2015-07-04 NOTE — Progress Notes (Signed)
Physical Therapy Treatment Patient Details Name: Lydia [name] MRN: [id number] DOB: March 26, 1961 Today's Date: 07/04/2015    History of Present Illness L TKA    PT Comments     Patient continues to fatigue quickly and has increased pain with ambulation. She does not feel ready to DC today and she would benefit from another day of therapy until she can move at more ease and decrease burden of care on caregivers.   Follow Up Recommendations  Home health PT     Equipment Recommendations  Rolling walker with 5" wheels    Recommendations for Other Services       Precautions / Restrictions Precautions Precautions: Knee;Fall Precaution Comments: Reviewed no pillow under knee and zero foam Restrictions LLE Weight Bearing: Weight bearing as tolerated    Mobility  Bed Mobility Overal bed mobility: Needs Assistance       Supine to sit: Min assist Sit to supine: Supervision   General bed mobility comments: Use of leg lifter back into bed.   Transfers Overall transfer level: Needs assistance Equipment used: Rolling walker (2 wheeled)   Sit to Stand: Supervision         General transfer comment: Cues for hand placement, safety, and sequencing.  Ambulation/Gait Ambulation/Gait assistance: Min guard Ambulation Distance (Feet): 80 Feet Assistive device: Rolling walker (2 wheeled) Gait Pattern/deviations: Step-through pattern;Decreased stride length;Decreased step length - right;Decreased stance time - left   Gait velocity interpretation: Below normal speed for age/gender General Gait Details: Patient continues to fatigue quickly however did not require sitting rest break this session. LImited by pain in UEs due to pressure on RW   Stairs            Wheelchair Mobility    Modified Rankin (Stroke Patients Only)       Balance                                    Cognition Arousal/Alertness: Awake/alert Behavior During Therapy: WFL for tasks  assessed/performed Overall Cognitive Status: Within Functional Limits for tasks assessed                      Exercises      General Comments        Pertinent Vitals/Pain Pain Score: 8  Pain Location: L knee Pain Descriptors / Indicators: Aching;Burning;Sore Pain Intervention(s): Monitored during session;Repositioned;Patient requesting pain meds-RN notified    Home Living                      Prior Function            PT Goals (current goals can now be found in the care plan section) Progress towards PT goals: Progressing toward goals    Frequency  7X/week    PT Plan Current plan remains appropriate    Co-evaluation             End of Session   Activity Tolerance: Patient limited by fatigue Patient left: in bed;with call bell/phone within reach     Time: 1445-1511 PT Time Calculation (min) (ACUTE ONLY): 26 min  Charges:  $Gait Training: 23-37 mins                    G Codes:      Jacqualyn Posey 07/04/2015, 3:19 PM 07/04/2015 Jacqualyn Posey PTA [phone] pager [phone] office

## 2015-07-04 NOTE — Progress Notes (Signed)
Orthopedic Tech Progress Note Patient Details:  Lydia [name] Aug 25, 1961 [id number] On cpm at 7:35 pm Patient ID: Lydia [name], female   DOB: March 06, 1961, 54 y.o.   MRN: [id number]   Braulio Bosch 07/04/2015, 7:38 PM

## 2015-07-04 NOTE — Progress Notes (Signed)
Occupational Therapy Treatment Patient Details Name: PORCHEA CHARRIER MRN: [id number] DOB: 08/04/1961 Today's Date: 07/04/2015    History of present illness L TKA   OT comments  Patient making progress towards OT goals, continue plan of care for now. No longer recommend HHOT, pt progressing enough to where her mother and family can independently assist her post acute d/c.    Follow Up Recommendations  Supervision/Assistance - 24 hour;No OT follow up    Equipment Recommendations  3 in 1 bedside comode    Recommendations for Other Services  None at this time   Precautions / Restrictions Precautions Precautions: Knee;Fall Precaution Comments: Reviewed no pillow under knee and zero foam Restrictions Weight Bearing Restrictions: Yes LLE Weight Bearing: Weight bearing as tolerated    Mobility Bed Mobility Overal bed mobility: Needs Assistance Bed Mobility: Supine to Sit     Supine to sit: Supervision Sit to supine: Supervision   General bed mobility comments: Use of bed rails, increased time and cues for LLE management.   Transfers Overall transfer level: Needs assistance Equipment used: Rolling walker (2 wheeled) Transfers: Sit to/from Stand Sit to Stand: Min guard General transfer comment: Cues for hand placement, safety, and sequencing.    Balance Overall balance assessment: Needs assistance Sitting-balance support: No upper extremity supported;Feet supported Sitting balance-Leahy Scale: Good     Standing balance support: Bilateral upper extremity supported;During functional activity Standing balance-Leahy Scale: Fair   ADL Overall ADL's : Needs assistance/impaired   General ADL Comments: While seated up in bed, pt able to don bilateral socks. Pt mentioned that her mother has AE for her to use, therapist encouraged patient to perform LB ADLs without use of AE. Patient's anxiety and pain better today. Pt sat EOB, ambulated > BR for toilet transfer then grooming tasks  while standing at sink. Pt left seated in recliner with all needs within reach for bathing. Encouraged patient to ambulate <> BR with assistance prn for toileting needs.      Cognition   Behavior During Therapy: WFL for tasks assessed/performed Overall Cognitive Status: Within Functional Limits for tasks assessed                 Pertinent Vitals/ Pain       Pain Assessment: Faces Pain Score: 6  Faces Pain Scale: Hurts even more Pain Location: L knee Pain Descriptors / Indicators: Grimacing;Aching;Sore Pain Intervention(s): Monitored during session;Repositioned   Frequency Min 2X/week     Progress Toward Goals  OT Goals(current goals can now befound in the care plan section)  Progress towards OT goals: Progressing toward goals     Plan Discharge plan needs to be updated    End of Session Equipment Utilized During Treatment: Gait belt;Rolling walker   Activity Tolerance Patient tolerated treatment well   Patient Left in chair;with call bell/phone within reach   Time: 1014-1035 OT Time Calculation (min): 21 min  Charges: OT General Charges $OT Visit: 1 Procedure OT Treatments $Self Care/Home Management : 8-22 mins  Georgeanna Radziewicz , MS, OTR/L, CLT Pager: [phone]  07/04/2015, 10:44 AM

## 2015-07-04 NOTE — Progress Notes (Signed)
Left message with Joanell Rising, PA to make him aware of PT's note that she could benefit from another day of therapy and that pt does not feel ready for D/C.  Eliezer Bottom [place]

## 2015-07-04 NOTE — Clinical Documentation Improvement (Signed)
.   Adult BMI (21 years +): Per progress notes: "Estimated body mass index is 42.21 kg/(m^2) as calculated from the following:  Height as of 06/21/15: 5\' 4"  (1.626 m).  Weight as of this encounter: 111.585 kg (246 lb)."   . Document any associated diagnoses/conditions, such as morbid obesity, malnutrition, etc.   Please specify patients associated diagnosis.  Thank You,  Melvia Heaps, RN, BSN, [place] [phone] [facility]. [email or web address]

## 2015-07-05 LAB — CBC
HEMATOCRIT: 32 % — AB (ref 36.0–46.0)
Hemoglobin: 10.9 g/dL — ABNORMAL LOW (ref 12.0–15.0)
MCH: 29.2 pg (ref 26.0–34.0)
MCHC: 34.1 g/dL (ref 30.0–36.0)
MCV: 85.8 fL (ref 78.0–100.0)
Platelets: 243 10*3/uL (ref 150–400)
RBC: 3.73 MIL/uL — AB (ref 3.87–5.11)
RDW: 14.5 % (ref 11.5–15.5)
WBC: 11.3 10*3/uL — AB (ref 4.0–10.5)

## 2015-07-05 LAB — GLUCOSE, CAPILLARY
GLUCOSE-CAPILLARY: 203 mg/dL — AB (ref 65–99)
GLUCOSE-CAPILLARY: 210 mg/dL — AB (ref 65–99)

## 2015-07-05 MED ORDER — DICLOFENAC SODIUM 75 MG PO TBEC
75.0000 mg | DELAYED_RELEASE_TABLET | Freq: Two times a day (BID) | ORAL | Status: DC
  Filled 2015-07-05 (×2): qty 1

## 2015-07-05 NOTE — Progress Notes (Addendum)
Patient ID: Lydia [name], female   DOB: 04/10/1961, 54 y.o.   MRN: [id number] PATIENT ID: Lydia [name]  MRN: [id number]  DOB/AGE:  1961/06/01 / 54 y.o.  3 Days Post-Op Procedure(s) (LRB): TOTAL KNEE ARTHROPLASTY (Left)    PROGRESS NOTE Subjective: Patient is alert, oriented, no Nausea, no Vomiting, yes passing gas, no Bowel Movement. Taking PO well. Denies SOB, Chest or Calf Pain. Using Incentive Spirometer, PAS in place. Ambulate 64ft, CPM 0-60 Patient reports pain as 4 on 0-10 scale  .    Objective: Vital signs in last 24 hours: Filed Vitals:   07/04/15 0612 07/04/15 1224 07/04/15 2053 07/05/15 0625  BP: 139/72 123/62 139/72 142/68  Pulse: 109 94 106 102  Temp: 98.5 F (36.9 C) 98.1 F (36.7 C) 98.3 F (36.8 C) 98.4 F (36.9 C)  TempSrc: Oral Oral    Resp: 18 18 18 18   Height:      Weight:      SpO2: 95% 98% 95% 97%      Intake/Output from previous day: I/O last 3 completed shifts: In: 59 [P.O.:820] Out: -    Intake/Output this shift:     LABORATORY DATA:  Recent Labs  07/03/15 0600  07/04/15 0415  07/04/15 1640 07/04/15 2306 07/05/15 0440 07/05/15 0646  WBC 13.7*  --  14.1*  --   --   --  11.3*  --   HGB 12.1  --  11.5*  --   --   --  10.9*  --   HCT 35.8*  --  34.0*  --   --   --  32.0*  --   PLT 236  --  252  --   --   --  243  --   NA 130*  --   --   --   --   --   --   --   K 3.7  --   --   --   --   --   --   --   CL 93*  --   --   --   --   --   --   --   CO2 29  --   --   --   --   --   --   --   BUN <5*  --   --   --   --   --   --   --   CREATININE 0.65  --   --   --   --   --   --   --   GLUCOSE 236*  --   --   --   --   --   --   --   GLUCAP  --   < >  --   < > 176* 181*  --  203*  CALCIUM 8.2*  --   --   --   --   --   --   --   < > = values in this interval not displayed.  Examination: Neurologically intact ABD soft Neurovascular intact Sensation intact distally Intact pulses distally Dorsiflexion/Plantar flexion  intact Incision: no drainage No cellulitis present Compartment soft}  Assessment:   3 Days Post-Op Procedure(s) (LRB): TOTAL KNEE ARTHROPLASTY (Left) ADDITIONAL DIAGNOSIS: Expected Acute Blood Loss Anemia, Diabetes, Hypertension and morbid obesity  Plan: PT/OT WBAT, CPM 5/hrs day until ROM 0-90 degrees, then D/C CPM, restart diclofenac DVT Prophylaxis:  SCDx72hrs, ASA 325 mg BID x 2  weeks DISCHARGE PLAN: Home, today after PT DISCHARGE NEEDS: HHPT, CPM, Walker and 3-in-1 comode seat     Tristin Vandeusen J 07/05/2015, 8:24 AM

## 2015-07-05 NOTE — Progress Notes (Signed)
Occupational Therapy Treatment Patient Details Name: Lydia [name] MRN: [id number] DOB: 28-Mar-1961 Today's Date: 07/05/2015    History of present illness L TKA   OT comments  Pt limited by some fatigue this session. She requested to return to bed after working with OT. Practiced shower transfer safety as pt states she does have a walk in shower she can use at home. Pt for d/c later today.    Follow Up Recommendations  No OT follow up;Supervision/Assistance - 24 hour    Equipment Recommendations  3 in 1 bedside comode (in room)    Recommendations for Other Services      Precautions / Restrictions Precautions Precautions: Knee;Fall Precaution Comments: Reviewed no pillow under knee and zero foam Restrictions LLE Weight Bearing: Weight bearing as tolerated       Mobility Bed Mobility Overal bed mobility: Needs Assistance Bed Mobility: Sit to Supine       Sit to supine: Min assist   General bed mobility comments: min assist for L LE onto bed.   Transfers Overall transfer level: Needs assistance Equipment used: Rolling walker (2 wheeled) Transfers: Sit to/from Stand Sit to Stand: Min guard         General transfer comment: cues for hand placement.    Balance                                   ADL                                   Tub/ Shower Transfer: Walk-in shower;Minimal assistance;Rolling walker;3 in 1     General ADL Comments: Pt states she feels comfortable with LB dressing and declined need to practice. Reviewed safety techniques to sit and don clothing over L LE first and then R and then stand to pull up with walker already in front of her. Pt stating she is very fatigued after doing PT session and wanting to take a shower before going home today. Pt states she does have a walker in shower option at home and prefers to use this rather than the tub. Practiced using walker to step back over shower ledge and then sit down on  3in1 as pt states not enough room to have walker in the shower and the 3in1 also. Pt did have some diffriculty with picking L LE off the floor and stepping back over a small shower height surface. She did better with stepping back over ledge to come out of shower. Cautioned pt to make sure she does not feel fatigued like she feels today when she does a shower at home with family and to consider not showering today if she continues to feel this fatigued. Noted pt to be also sweaty but she does not report any dizziness, just states she is very tired. Discussed sitting down for initial showers for safety also. She does not have a handheld shower but is aware that this is an option if she doesnt want the shower water coming down at the side of her. Reviewed sequence for stepping in and out of shower several times and the importance of family holding walker steady. Also cautioned pt to make sure she can pick up L LE far enough to clear shower before attempting shower at home. recommended she sponge bathe tomorrow and give herself another day to increase strength, activity  tolerance and ability to pick up L LE better. Pt verbalized understanding.       Vision                     Perception     Praxis      Cognition   Behavior During Therapy: WFL for tasks assessed/performed Overall Cognitive Status: Within Functional Limits for tasks assessed                       Extremity/Trunk Assessment               Exercises    Shoulder Instructions       General Comments      Pertinent Vitals/ Pain       Pain Assessment: 0-10 Pain Score: 4  Faces Pain Scale: Hurts even more Pain Location: L knee Pain Descriptors / Indicators: Aching;Sore Pain Intervention(s): Repositioned  Home Living                                          Prior Functioning/Environment              Frequency Min 2X/week     Progress Toward Goals  OT Goals(current goals can  now be found in the care plan section)  Progress towards OT goals: Progressing toward goals     Plan Discharge plan remains appropriate    Co-evaluation                 End of Session Equipment Utilized During Treatment: Gait belt;Rolling walker   Activity Tolerance Patient limited by fatigue   Patient Left in bed;with call bell/phone within reach   Nurse Communication          Time: 2979-8921 OT Time Calculation (min): 20 min  Charges: OT General Charges $OT Visit: 1 Procedure OT Treatments $Therapeutic Activity: 8-22 mins  Jules Schick  [phone] 07/05/2015, 12:17 PM

## 2015-07-05 NOTE — Progress Notes (Signed)
Physical Therapy Treatment Patient Details Name: Lydia [name] MRN: [id number] DOB: 05-02-1961 Today's Date: 2015-07-25    History of Present Illness L TKA    PT Comments    Pt progressing with ambulation. Only needed 1 standing rest break while walking. Plan to D/C this afternoon. Continue to recommend HHPT for ongoing therapy to increase functional independence.  Follow Up Recommendations  Home health PT     Equipment Recommendations  Rolling walker with 5" wheels    Recommendations for Other Services       Precautions / Restrictions Precautions Precautions: Knee;Fall Precaution Comments: Reviewed no pillow under knee and zero foam Restrictions Weight Bearing Restrictions: Yes LLE Weight Bearing: Weight bearing as tolerated    Mobility  Bed Mobility               General bed mobility comments: Pt found in chair and returned to chair after session.  Transfers Overall transfer level: Needs assistance Equipment used: Rolling walker (2 wheeled) Transfers: Sit to/from Stand Sit to Stand: Supervision         General transfer comment: Cues for hand placement, safety, and sequencing.  Ambulation/Gait Ambulation/Gait assistance: Min guard Ambulation Distance (Feet): 100 Feet Assistive device: Rolling walker (2 wheeled) Gait Pattern/deviations: Step-through pattern;Decreased stride length;Decreased step length - right;Decreased stance time - left     General Gait Details: Patient continues to fatigue quickly however did not require sitting rest break this session. LImited by pain in UEs due to pressure on RW   Stairs            Wheelchair Mobility    Modified Rankin (Stroke Patients Only)       Balance                                    Cognition Arousal/Alertness: Awake/alert Behavior During Therapy: WFL for tasks assessed/performed Overall Cognitive Status: Within Functional Limits for tasks assessed                       Exercises Total Joint Exercises Quad Sets: Strengthening;Left;10 reps;Seated Heel Slides: Left;10 reps;Seated;AAROM Straight Leg Raises: AAROM;Left;10 reps;Seated Long Arc Quad: AAROM;Left;5 reps;Seated    General Comments        Pertinent Vitals/Pain Faces Pain Scale: Hurts even more Pain Location: L knee Pain Descriptors / Indicators: Aching;Sore Pain Intervention(s): Monitored during session;Repositioned    Home Living                      Prior Function            PT Goals (current goals can now be found in the care plan section) Progress towards PT goals: Progressing toward goals    Frequency  7X/week    PT Plan Current plan remains appropriate    Co-evaluation             End of Session Equipment Utilized During Treatment: Gait belt Activity Tolerance: Patient tolerated treatment well Patient left: in bed;with call bell/phone within reach     Time: 0933-1015 PT Time Calculation (min) (ACUTE ONLY): 42 min  Charges:                       G CodesAllyn Kenner, SPTA 2015-07-25, 10:25 AM

## 2016-04-17 ENCOUNTER — Telehealth: Payer: Self-pay | Admitting: *Deleted

## 2016-04-17 NOTE — Telephone Encounter (Signed)
Pt called c/o spotting this am with Mirena IUD, I called pt back and told her not abnormal to see breakthrough bleeding. Pt said she noticed it with wiping only, never had spotting before. Pt advised to watch for now and follow up with OV if bleeding should increase.

## 2016-05-28 ENCOUNTER — Encounter: Payer: Self-pay | Admitting: Gynecology

## 2016-05-28 ENCOUNTER — Ambulatory Visit (INDEPENDENT_AMBULATORY_CARE_PROVIDER_SITE_OTHER): Payer: BC Managed Care – PPO | Admitting: Gynecology

## 2016-05-28 VITALS — BP 136/88 | Ht 63.5 in | Wt 273.0 lb

## 2016-05-28 DIAGNOSIS — Z975 Presence of (intrauterine) contraceptive device: Secondary | ICD-10-CM

## 2016-05-28 DIAGNOSIS — N921 Excessive and frequent menstruation with irregular cycle: Secondary | ICD-10-CM

## 2016-05-28 DIAGNOSIS — Z01419 Encounter for gynecological examination (general) (routine) without abnormal findings: Secondary | ICD-10-CM | POA: Diagnosis not present

## 2016-05-28 DIAGNOSIS — K429 Umbilical hernia without obstruction or gangrene: Secondary | ICD-10-CM | POA: Diagnosis not present

## 2016-05-28 NOTE — Progress Notes (Signed)
Lydia [name] 09-08-1961 [id number]   History:    55 y.o.  for annual gyn exam who has not been seen in the office since 2013. Patient received reported that she had no vaginal bleeding since her IUD was placed in 2013 (Mirena). 2 months ago she bled for a week and then she spotted for 3 more weeks after that. She reports no weight changes.  Review of her record indicated that in April 2012 her ultrasound demonstrated that she had 3 intramural myomas the largest one measuring 39 x 47 x 50 mm. She did have a right echo-free cyst measuring 35 x 18 x 29 mm negative color flow. Left ovary had a tubular cystic mass negative color flow measuring 26 x 27 x 12 mm and her sonohysterogram showed no intracavitary defect. Patient also had had an endometrial biopsy the week prior and had a benign endometrium and then shortly thereafter had the Mirena IUD placed.  Patient with no past history of abnormal Pap smears. Patient reports normal colonoscopy 2 years ago.  Past medical history,surgical history, family history and social history were all reviewed and documented in the EPIC chart.  Gynecologic History No LMP recorded. Patient is not currently having periods (Reason: IUD). Contraception: IUD Last Pap: 2012. Results were: normal Last mammogram: 2013. Results were: normal  Obstetric History OB History  Gravida Para Term Preterm AB SAB TAB Ectopic Multiple Living  1 1  1      1     # Outcome Date GA Lbr Len/2nd Weight Sex Delivery Anes PTL Lv  1 Preterm     M CS-Unspec  Y Y       ROS: A ROS was performed and pertinent positives and negatives are included in the history.  GENERAL: No fevers or chills. HEENT: No change in vision, no earache, sore throat or sinus congestion. NECK: No pain or stiffness. CARDIOVASCULAR: No chest pain or pressure. No palpitations. PULMONARY: No shortness of breath, cough or wheeze. GASTROINTESTINAL: No abdominal pain, nausea, vomiting or diarrhea, melena or bright  red blood per rectum. GENITOURINARY: No urinary frequency, urgency, hesitancy or dysuria. MUSCULOSKELETAL: No joint or muscle pain, no back pain, no recent trauma. DERMATOLOGIC: No rash, no itching, no lesions. ENDOCRINE: No polyuria, polydipsia, no heat or cold intolerance. No recent change in weight. HEMATOLOGICAL: No anemia or easy bruising or bleeding. NEUROLOGIC: No headache, seizures, numbness, tingling or weakness. PSYCHIATRIC: No depression, no loss of interest in normal activity or change in sleep pattern.     Exam: chaperone present  BP 136/88 mmHg  Ht 5' 3.5" (1.613 m)  Wt 273 lb (123.832 kg)  BMI 47.60 kg/m2  Body mass index is 47.6 kg/(m^2).  General appearance : Well developed well nourished female. No acute distress HEENT: Eyes: no retinal hemorrhage or exudates,  Neck supple, trachea midline, no carotid bruits, no thyroidmegaly Lungs: Clear to auscultation, no rhonchi or wheezes, or rib retractions  Heart: Regular rate and rhythm, no murmurs or gallops Breast:Examined in sitting and supine position were symmetrical in appearance, no palpable masses or tenderness,  no skin retraction, no nipple inversion, no nipple discharge, no skin discoloration, no axillary or supraclavicular lymphadenopathy Abdomen: no palpable masses or tenderness, no rebound or guarding Extremities: no edema or skin discoloration or tenderness  Pelvic:  Bartholin, Urethra, Skene Glands: Within normal limits             Vagina: No gross lesions or discharge  Cervix: No gross lesions or discharge,  IUD string visualized  Uterus  anteverted, normal size, shape and consistency, non-tender and mobile  Adnexa  Without masses or tenderness  Anus and perineum  normal   Rectovaginal  normal sphincter tone without palpated masses or tenderness             Hemoccult cards provided   The patient was counseled for an endometrial biopsy. After Pap smear was obtained with HPV screen Betadine solution was applied  to the cervix. A single-tooth tenaculum was placed on the anterior cervical lip and a Pipelle was introduced into the uterine cavity. Uterus sounded 7/2 cm moderate amount of tissue was obtained was submitted for histological evaluation. The IUD was not disturbed. The CO2 tenaculum was removed. Patient tolerated procedure well.  Assessment/Plan:  55 y.o. female for annual exam with dysfunctional uterine bleeding 55 years of age with a Mirena IUD. Pap smear with HPV screening along with endometrial biopsy done today result pending at time of this dictation. She was provided with a requisition to schedule her overdue mammogram. She hasn't scheduled surgery next week for umbilical hernia repairs visual return back for the ultrasound in 2-3 weeks. Her PCP has been doing her blood work.   Terrance Mass MD, 12:01 PM 05/28/2016

## 2016-05-28 NOTE — Patient Instructions (Signed)

## 2016-05-30 LAB — PAP, TP IMAGING W/ HPV RNA, RFLX HPV TYPE 16,18/45: HPV MRNA, HIGH RISK: NOT DETECTED

## 2016-06-12 ENCOUNTER — Telehealth: Payer: Self-pay | Admitting: Gynecology

## 2016-06-12 ENCOUNTER — Other Ambulatory Visit: Payer: Self-pay | Admitting: Gynecology

## 2016-06-12 DIAGNOSIS — N939 Abnormal uterine and vaginal bleeding, unspecified: Secondary | ICD-10-CM

## 2016-06-12 NOTE — Telephone Encounter (Signed)
06/12/16-I LM VM for pt that her BC covers the sonohysterogram,bx if needed with a $45 copay. Per Tim@BC -[id number]

## 2016-06-20 ENCOUNTER — Other Ambulatory Visit: Payer: BC Managed Care – PPO

## 2016-06-20 ENCOUNTER — Ambulatory Visit: Payer: BC Managed Care – PPO | Admitting: Gynecology

## 2016-07-07 ENCOUNTER — Ambulatory Visit (INDEPENDENT_AMBULATORY_CARE_PROVIDER_SITE_OTHER): Payer: BC Managed Care – PPO

## 2016-07-07 ENCOUNTER — Other Ambulatory Visit: Payer: Self-pay | Admitting: Gynecology

## 2016-07-07 ENCOUNTER — Ambulatory Visit (INDEPENDENT_AMBULATORY_CARE_PROVIDER_SITE_OTHER): Payer: BC Managed Care – PPO | Admitting: Gynecology

## 2016-07-07 DIAGNOSIS — N921 Excessive and frequent menstruation with irregular cycle: Secondary | ICD-10-CM | POA: Diagnosis not present

## 2016-07-07 DIAGNOSIS — N7011 Chronic salpingitis: Secondary | ICD-10-CM

## 2016-07-07 DIAGNOSIS — D251 Intramural leiomyoma of uterus: Secondary | ICD-10-CM

## 2016-07-07 DIAGNOSIS — N939 Abnormal uterine and vaginal bleeding, unspecified: Secondary | ICD-10-CM

## 2016-07-07 DIAGNOSIS — Z975 Presence of (intrauterine) contraceptive device: Principal | ICD-10-CM

## 2016-07-07 DIAGNOSIS — N8312 Corpus luteum cyst of left ovary: Secondary | ICD-10-CM

## 2016-07-07 DIAGNOSIS — T8383XD Hemorrhage of genitourinary prosthetic devices, implants and grafts, subsequent encounter: Principal | ICD-10-CM

## 2016-07-07 DIAGNOSIS — IMO0001 Reserved for inherently not codable concepts without codable children: Secondary | ICD-10-CM

## 2016-07-07 DIAGNOSIS — N923 Ovulation bleeding: Secondary | ICD-10-CM

## 2016-07-07 NOTE — Progress Notes (Signed)
   Patient is a 55 year old was seen in the office for her annual exam last month. Patient had a Mirena IUD placed in 2013 and had done well until recently she complained 2 months ago that she had been bleeding for a week then spotted for 3 more weeks. On the last office visit she had an endometrial biopsy which turned straight the following:  Diagnosis Endometrium, biopsy, uterus - BENIGN ENDOMETRIUM WITH FEATURES OF EXOGENOUS HORMONE EFFECT ASSOCIATED WITH FOCI OF BREAKDOWN. - NO HYPERPLASIA, ATYPIA OR MALIGNANCY IDENTIFIED.  She has had no further bleeding and is here for an ultrasound/sono hysterogram which demonstrated the following:  Uterus measured 8.4 x 5.8 x 4.8 cm with endometrial stripe of 3.1 mm. Fibroid measuring 20 x 11 mm, 25 x 60 mm was noted. IUD was found to be in normal position. Right ovary was normal. Left thick wall cyst with internal low level echoes measuring 16 x 10 mm negative color flow was noted. Left tubular fluid for quite area and negative color flow 27 x 11 x 32 mm. No fluid in the cul-de-sac. The cervix was cleansed with Betadine solution a sterile catheter was introduced into the uterine cavity and normal saline was instilled and no intracavitary defect was noted.  Assessment/plan: Isolated episode of vaginal bleeding after having had Mirena IUD for several years normal endometrial biopsy ultrasound demonstrated small intramural myoma and possibly a corpus luteum cyst on one ovary otherwise normal. Patient was asymptomatic. Patient to return back next year for annual exam and to change her IUD.

## 2016-07-07 NOTE — Patient Instructions (Signed)
Uterine Fibroids Uterine fibroids are tissue masses (tumors) that can develop in the womb (uterus). They are also called leiomyomas. This type of tumor is not cancerous (benign) and does not spread to other parts of the body outside of the pelvic area, which is between the hip bones. Occasionally, fibroids may develop in the fallopian tubes, in the cervix, or on the support structures (ligaments) that surround the uterus. You can have one or many fibroids. Fibroids can vary in size, weight, and where they grow in the uterus. Some can become quite large. Most fibroids do not require medical treatment. CAUSES A fibroid can develop when a single uterine cell keeps growing (replicating). Most cells in the human body have a control mechanism that keeps them from replicating without control. SIGNS AND SYMPTOMS Symptoms may include:   Heavy bleeding during your period.  Bleeding or spotting between periods.  Pelvic pain and pressure.  Bladder problems, such as needing to urinate more often (urinary frequency) or urgently.  Inability to reproduce offspring (infertility).  Miscarriages. DIAGNOSIS Uterine fibroids are diagnosed through a physical exam. Your health care provider may feel the lumpy tumors during a pelvic exam. Ultrasonography and an MRI may be done to determine the size, location, and number of fibroids. TREATMENT Treatment may include:  Watchful waiting. This involves getting the fibroid checked by your health care provider to see if it grows or shrinks. Follow your health care provider's recommendations for how often to have this checked.  Hormone medicines. These can be taken by mouth or given through an intrauterine device (IUD).  Surgery.  Removing the fibroids (myomectomy) or the uterus (hysterectomy).  Removing blood supply to the fibroids (uterine artery embolization). If fibroids interfere with your fertility and you want to become pregnant, your health care provider  may recommend having the fibroids removed.  HOME CARE INSTRUCTIONS  Keep all follow-up visits as directed by your health care provider. This is important.  Take medicines only as directed by your health care provider.  If you were prescribed a hormone treatment, take the hormone medicines exactly as directed.  Do not take aspirin, because it can cause bleeding.  Ask your health care provider about taking iron pills and increasing the amount of dark green, leafy vegetables in your diet. These actions can help to boost your blood iron levels, which may be affected by heavy menstrual bleeding.  Pay close attention to your period and tell your health care provider about any changes, such as:  Increased blood flow that requires you to use more pads or tampons than usual per month.  A change in the number of days that your period lasts per month.  A change in symptoms that are associated with your period, such as abdominal cramping or back pain. SEEK MEDICAL CARE IF:  You have pelvic pain, back pain, or abdominal cramps that cannot be controlled with medicines.  You have an increase in bleeding between and during periods.  You soak tampons or pads in a half hour or less.  You feel lightheaded, extra tired, or weak. SEEK IMMEDIATE MEDICAL CARE IF:  You faint.  You have a sudden increase in pelvic pain.   This information is not intended to replace advice given to you by your health care provider. Make sure you discuss any questions you have with your health care provider.   Document Released: 11/21/2000 Document Revised: 12/15/2014 Document Reviewed: 05/23/2014 Elsevier Interactive Patient Education 2016 [employer]  

## 2016-07-18 ENCOUNTER — Encounter: Payer: Self-pay | Admitting: Gynecology

## 2017-04-22 ENCOUNTER — Encounter: Payer: Self-pay | Admitting: Gynecology

## 2017-09-02 ENCOUNTER — Encounter: Payer: Self-pay | Admitting: Women's Health

## 2017-09-02 ENCOUNTER — Ambulatory Visit (INDEPENDENT_AMBULATORY_CARE_PROVIDER_SITE_OTHER): Payer: BC Managed Care – PPO | Admitting: Women's Health

## 2017-09-02 VITALS — BP 118/80 | Ht 64.0 in | Wt 272.0 lb

## 2017-09-02 DIAGNOSIS — Z01419 Encounter for gynecological examination (general) (routine) without abnormal findings: Secondary | ICD-10-CM

## 2017-09-02 DIAGNOSIS — Z30432 Encounter for removal of intrauterine contraceptive device: Secondary | ICD-10-CM

## 2017-09-02 NOTE — Progress Notes (Signed)
Lydia [name] Feb 17, 1961 [id number]    History:    Presents for annual exam.  Amenorrheic Mirena IUD since 2013. Normal Pap and mammogram history. Fibroids largest  6 cm. Negative colonoscopy 2015. Numerous health problems, hypertension, diabetes, hypercholesterolemia, TIAs, right knee pain, reports weight loss needed prior to replacement surgery, morbid obesity.  Past medical history, past surgical history, family history and social history were all reviewed and documented in the EPIC chart. First grade teacher. One child age 54 doing well.  ROS:  A ROS was performed and pertinent positives and negatives are included.  Exam:  Vitals:   09/02/17 1438  BP: 118/80  Weight: 272 lb (123.4 kg)  Height: 5\' 4"  (1.626 m)   Body mass index is 46.69 kg/m.   General appearance:  Normal Thyroid:  Symmetrical, normal in size, without palpable masses or nodularity. Respiratory  Auscultation:  Clear without wheezing or rhonchi Cardiovascular  Auscultation:  Regular rate, without rubs, murmurs or gallops  Edema/varicosities:  Not grossly evident Abdominal  Soft,nontender, without masses, guarding or rebound.  Liver/spleen:  No organomegaly noted  Hernia:  None appreciated  Skin  Inspection:  Grossly normal   Breasts: Examined lying and sitting.     Right: Without masses, retractions, discharge or axillary adenopathy.     Left: Without masses, retractions, discharge or axillary adenopathy. Gentitourinary   Inguinal/mons:  Normal without inguinal adenopathy  External genitalia:  Normal  BUS/Urethra/Skene's glands:  Normal  Vagina:  Normal  Cervix:  Normal IUD strings grasped with ring forcep, removed intact shown to patient and discarded.  Uterus: limited exam/abdominal growth  Adnexa/parametria:     Rt: Without masses or tenderness.   Lt: Without masses or tenderness.  Anus and perineum: Normal  Digital rectal exam: Normal sphincter tone without palpated masses or  tenderness  Assessment/Plan:  56 y.o. MWF G1 for annual exam with no GYN complaints.  Mirena IUD removed Hypertension/diabetes/hypercholesterolemia-primary care manages labs and meds Morbid obesity Chronic right knee pain Fibroid uterus  Plan: Instructed to call if any bleeding or intolerable menopausal symptoms. SBE's, continue annual screening mammogram, due instructed to schedule. Reviewed importance of increasing exercise as able, decreasing calories/carbs, weight loss surgery briefly reviewed. Weight Watchers encouraged. Pap normal with negative HR HPV 2017, new screening guidelines reviewed.    Boyle, 3:21 PM 09/02/2017

## 2017-09-02 NOTE — Patient Instructions (Signed)
Health Maintenance for Postmenopausal Women Menopause is a normal process in which your reproductive ability comes to an end. This process happens gradually over a span of months to years, usually between the ages of 22 and 9. Menopause is complete when you have missed 12 consecutive menstrual periods. It is important to talk with your health care provider about some of the most common conditions that affect postmenopausal women, such as heart disease, cancer, and bone loss (osteoporosis). Adopting a healthy lifestyle and getting preventive care can help to promote your health and wellness. Those actions can also lower your chances of developing some of these common conditions. What should I know about menopause? During menopause, you may experience a number of symptoms, such as:  Moderate-to-severe hot flashes.  Night sweats.  Decrease in sex drive.  Mood swings.  Headaches.  Tiredness.  Irritability.  Memory problems.  Insomnia.  Choosing to treat or not to treat menopausal changes is an individual decision that you make with your health care provider. What should I know about hormone replacement therapy and supplements? Hormone therapy products are effective for treating symptoms that are associated with menopause, such as hot flashes and night sweats. Hormone replacement carries certain risks, especially as you become older. If you are thinking about using estrogen or estrogen with progestin treatments, discuss the benefits and risks with your health care provider. What should I know about heart disease and stroke? Heart disease, heart attack, and stroke become more likely as you age. This may be due, in part, to the hormonal changes that your body experiences during menopause. These can affect how your body processes dietary fats, triglycerides, and cholesterol. Heart attack and stroke are both medical emergencies. There are many things that you can do to help prevent heart disease  and stroke:  Have your blood pressure checked at least every 1-2 years. High blood pressure causes heart disease and increases the risk of stroke.  If you are 53-22 years old, ask your health care provider if you should take aspirin to prevent a heart attack or a stroke.  Do not use any tobacco products, including cigarettes, chewing tobacco, or electronic cigarettes. If you need help quitting, ask your health care provider.  It is important to eat a healthy diet and maintain a healthy weight. ? Be sure to include plenty of vegetables, fruits, low-fat dairy products, and lean protein. ? Avoid eating foods that are high in solid fats, added sugars, or salt (sodium).  Get regular exercise. This is one of the most important things that you can do for your health. ? Try to exercise for at least 150 minutes each week. The type of exercise that you do should increase your heart rate and make you sweat. This is known as moderate-intensity exercise. ? Try to do strengthening exercises at least twice each week. Do these in addition to the moderate-intensity exercise.  Know your numbers.Ask your health care provider to check your cholesterol and your blood glucose. Continue to have your blood tested as directed by your health care provider.  What should I know about cancer screening? There are several types of cancer. Take the following steps to reduce your risk and to catch any cancer development as early as possible. Breast Cancer  Practice breast self-awareness. ? This means understanding how your breasts normally appear and feel. ? It also means doing regular breast self-exams. Let your health care provider know about any changes, no matter how small.  If you are 40  or older, have a clinician do a breast exam (clinical breast exam or CBE) every year. Depending on your age, family history, and medical history, it may be recommended that you also have a yearly breast X-ray (mammogram).  If you  have a family history of breast cancer, talk with your health care provider about genetic screening.  If you are at high risk for breast cancer, talk with your health care provider about having an MRI and a mammogram every year.  Breast cancer (BRCA) gene test is recommended for women who have family members with BRCA-related cancers. Results of the assessment will determine the need for genetic counseling and BRCA1 and for BRCA2 testing. BRCA-related cancers include these types: ? Breast. This occurs in males or females. ? Ovarian. ? Tubal. This may also be called fallopian tube cancer. ? Cancer of the abdominal or pelvic lining (peritoneal cancer). ? Prostate. ? Pancreatic.  Cervical, Uterine, and Ovarian Cancer Your health care provider may recommend that you be screened regularly for cancer of the pelvic organs. These include your ovaries, uterus, and vagina. This screening involves a pelvic exam, which includes checking for microscopic changes to the surface of your cervix (Pap test).  For women ages 21-65, health care providers may recommend a pelvic exam and a Pap test every three years. For women ages 79-65, they may recommend the Pap test and pelvic exam, combined with testing for human papilloma virus (HPV), every five years. Some types of HPV increase your risk of cervical cancer. Testing for HPV may also be done on women of any age who have unclear Pap test results.  Other health care providers may not recommend any screening for nonpregnant women who are considered low risk for pelvic cancer and have no symptoms. Ask your health care provider if a screening pelvic exam is right for you.  If you have had past treatment for cervical cancer or a condition that could lead to cancer, you need Pap tests and screening for cancer for at least 20 years after your treatment. If Pap tests have been discontinued for you, your risk factors (such as having a new sexual partner) need to be  reassessed to determine if you should start having screenings again. Some women have medical problems that increase the chance of getting cervical cancer. In these cases, your health care provider may recommend that you have screening and Pap tests more often.  If you have a family history of uterine cancer or ovarian cancer, talk with your health care provider about genetic screening.  If you have vaginal bleeding after reaching menopause, tell your health care provider.  There are currently no reliable tests available to screen for ovarian cancer.  Lung Cancer Lung cancer screening is recommended for adults 69-62 years old who are at high risk for lung cancer because of a history of smoking. A yearly low-dose CT scan of the lungs is recommended if you:  Currently smoke.  Have a history of at least 30 pack-years of smoking and you currently smoke or have quit within the past 15 years. A pack-year is smoking an average of one pack of cigarettes per day for one year.  Yearly screening should:  Continue until it has been 15 years since you quit.  Stop if you develop a health problem that would prevent you from having lung cancer treatment.  Colorectal Cancer  This type of cancer can be detected and can often be prevented.  Routine colorectal cancer screening usually begins at  age 42 and continues through age 45.  If you have risk factors for colon cancer, your health care provider may recommend that you be screened at an earlier age.  If you have a family history of colorectal cancer, talk with your health care provider about genetic screening.  Your health care provider may also recommend using home test kits to check for hidden blood in your stool.  A small camera at the end of a tube can be used to examine your colon directly (sigmoidoscopy or colonoscopy). This is done to check for the earliest forms of colorectal cancer.  Direct examination of the colon should be repeated every  5-10 years until age 71. However, if early forms of precancerous polyps or small growths are found or if you have a family history or genetic risk for colorectal cancer, you may need to be screened more often.  Skin Cancer  Check your skin from head to toe regularly.  Monitor any moles. Be sure to tell your health care provider: ? About any new moles or changes in moles, especially if there is a change in a mole's shape or color. ? If you have a mole that is larger than the size of a pencil eraser.  If any of your family members has a history of skin cancer, especially at a Wilfred Siverson age, talk with your health care provider about genetic screening.  Always use sunscreen. Apply sunscreen liberally and repeatedly throughout the day.  Whenever you are outside, protect yourself by wearing long sleeves, pants, a wide-brimmed hat, and sunglasses.  What should I know about osteoporosis? Osteoporosis is a condition in which bone destruction happens more quickly than new bone creation. After menopause, you may be at an increased risk for osteoporosis. To help prevent osteoporosis or the bone fractures that can happen because of osteoporosis, the following is recommended:  If you are 46-71 years old, get at least 1,000 mg of calcium and at least 600 mg of vitamin D per day.  If you are older than age 55 but younger than age 65, get at least 1,200 mg of calcium and at least 600 mg of vitamin D per day.  If you are older than age 54, get at least 1,200 mg of calcium and at least 800 mg of vitamin D per day.  Smoking and excessive alcohol intake increase the risk of osteoporosis. Eat foods that are rich in calcium and vitamin D, and do weight-bearing exercises several times each week as directed by your health care provider. What should I know about how menopause affects my mental health? Depression may occur at any age, but it is more common as you become older. Common symptoms of depression  include:  Low or sad mood.  Changes in sleep patterns.  Changes in appetite or eating patterns.  Feeling an overall lack of motivation or enjoyment of activities that you previously enjoyed.  Frequent crying spells.  Talk with your health care provider if you think that you are experiencing depression. What should I know about immunizations? It is important that you get and maintain your immunizations. These include:  Tetanus, diphtheria, and pertussis (Tdap) booster vaccine.  Influenza every year before the flu season begins.  Pneumonia vaccine.  Shingles vaccine.  Your health care provider may also recommend other immunizations. This information is not intended to replace advice given to you by your health care provider. Make sure you discuss any questions you have with your health care provider. Document Released: 01/16/2006  Document Revised: 06/13/2016 Document Reviewed: 08/28/2015 Elsevier Interactive Patient Education  [employer]. Bariatric Surgery Information Bariatric surgery, also called weight loss surgery, is a procedure that helps you lose weight. You may consider or your health care provider may suggest bariatric surgery if:  You are severely obese and have been unable to lose weight through diet and exercise.  You have health problems related to obesity, such as: ? Type 2 diabetes. ? Heart disease. ? Lung disease.  How does bariatric surgery help me lose weight? Bariatric surgery helps you lose weight by decreasing how much food your body absorbs. This is done by closing off part of your stomach to make it smaller. This restricts the amount of food your stomach can hold. Bariatric surgery can also change your body's regular digestive process, so that food bypasses the parts of your body that absorb calories and nutrients. If you decide to have bariatric surgery, it is important to continue to eat a healthy diet and exercise regularly after the  surgery. What are the different kinds of bariatric surgery? There are two kinds of bariatric surgeries:  Restrictive surgeries make your stomach smaller. They do not change your digestive process. The smaller the size of your new stomach, the less food you can eat. There are different types of restrictive surgeries.  Malabsorptive surgeries both make your stomach smaller and alter your digestive process so that your body processes less calories and nutrients. These are the most common kind of bariatric surgery. There are different types of malabsorptive surgeries.  What are the different types of restrictive surgery? Adjustable Gastric Banding In this procedure, an inflatable band is placed around your stomach near the upper end. This makes the passageway for food into the rest of your stomach much smaller. The band can be adjusted, making it tighter or looser, by filling it with salt solution. Your surgeon can adjust the band based on how are you feeling and how much weight you are losing. The band can be removed in the future. Vertical Banded Gastroplasty In this procedure, staples are used to separate your stomach into two parts, a small upper pouch and a bigger lower pouch. This decreases how much food you can eat. Sleeve Gastrectomy In this procedure, your stomach is made smaller. This is done by surgically removing a large part of your stomach. When your stomach is smaller, you feel full more quickly and reduce how much you eat. What are the different types of malabsorptive surgery? Roux-en-Y Gastric Bypass (RGB) This is the most common weight loss surgery. In this procedure, a small stomach pouch is created in the upper part of your stomach. Next, this small stomach pouch is attached directly to the middle part of your small intestine. The farther down your small intestine the new connection is made, the fewer calories and nutrients you will absorb. Biliopancreatic Diversion with Duodenal  Switch (BPD/DS) This is a multi-step procedure. In this procedure, a large part of your stomach is removed, making your stomach smaller. Next, this smaller stomach is attached to the lower part of your small intestine. Like the RGB surgery, you absorb fewer calories and nutrients the farther down your small intestine the attachment is made. What are the risks of bariatric surgery? As with any surgical procedure, each type of bariatric surgery has its own risks. These risks also depend on your age, your overall health, and any other medical conditions you may have. When deciding on bariatric surgery, it is very important to:  Talk to your health care provider and choose the surgery that is best for you.  Ask your health care provider about specific risks for the surgery you choose.  Where to find more information:  American Society for Metabolic & Bariatric Surgery: www.asmbs.org  Weight-control Information Network (WIN): win.StageSync.si This information is not intended to replace advice given to you by your health care provider. Make sure you discuss any questions you have with your health care provider. Document Released: 11/24/2005 Document Revised: 05/01/2016 Document Reviewed: 05/25/2013 Elsevier Interactive Patient Education  2017 [employer]. Carbohydrate Counting for Diabetes Mellitus, Adult Carbohydrate counting is a method for keeping track of how many carbohydrates you eat. Eating carbohydrates naturally increases the amount of sugar (glucose) in the blood. Counting how many carbohydrates you eat helps keep your blood glucose within normal limits, which helps you manage your diabetes (diabetes mellitus). It is important to know how many carbohydrates you can safely have in each meal. This is different for every person. A diet and nutrition specialist (registered dietitian) can help you make a meal plan and calculate how many carbohydrates you should have at each meal and  snack. Carbohydrates are found in the following foods:  Grains, such as breads and cereals.  Dried beans and soy products.  Starchy vegetables, such as potatoes, peas, and corn.  Fruit and fruit juices.  Milk and yogurt.  Sweets and snack foods, such as cake, cookies, candy, chips, and soft drinks.  How do I count carbohydrates? There are two ways to count carbohydrates in food. You can use either of the methods or a combination of both. Reading "Nutrition Facts" on packaged food The "Nutrition Facts" list is included on the labels of almost all packaged foods and beverages in the [place] It includes:  The serving size.  Information about nutrients in each serving, including the grams (g) of carbohydrate per serving.  To use the "Nutrition Facts":  Decide how many servings you will have.  Multiply the number of servings by the number of carbohydrates per serving.  The resulting number is the total amount of carbohydrates that you will be having.  Learning standard serving sizes of other foods When you eat foods containing carbohydrates that are not packaged or do not include "Nutrition Facts" on the label, you need to measure the servings in order to count the amount of carbohydrates:  Measure the foods that you will eat with a food scale or measuring cup, if needed.  Decide how many standard-size servings you will eat.  Multiply the number of servings by 15. Most carbohydrate-rich foods have about 15 g of carbohydrates per serving. ? For example, if you eat 8 oz (170 g) of strawberries, you will have eaten 2 servings and 30 g of carbohydrates (2 servings x 15 g = 30 g).  For foods that have more than one food mixed, such as soups and casseroles, you must count the carbohydrates in each food that is included.  The following list contains standard serving sizes of common carbohydrate-rich foods. Each of these servings has about 15 g of carbohydrates:   hamburger bun or   English muffin.   oz (15 mL) syrup.   oz (14 g) jelly.  1 slice of bread.  1 six-inch tortilla.  3 oz (85 g) cooked rice or pasta.  4 oz (113 g) cooked dried beans.  4 oz (113 g) starchy vegetable, such as peas, corn, or potatoes.  4 oz (113 g) hot cereal.  4  oz (113 g) mashed potatoes or  of a large baked potato.  4 oz (113 g) canned or frozen fruit.  4 oz (120 mL) fruit juice.  4-6 crackers.  6 chicken nuggets.  6 oz (170 g) unsweetened dry cereal.  6 oz (170 g) plain fat-free yogurt or yogurt sweetened with artificial sweeteners.  8 oz (240 mL) milk.  8 oz (170 g) fresh fruit or one small piece of fruit.  24 oz (680 g) popped popcorn.  Example of carbohydrate counting Sample meal  3 oz (85 g) chicken breast.  6 oz (170 g) brown rice.  4 oz (113 g) corn.  8 oz (240 mL) milk.  8 oz (170 g) strawberries with sugar-free whipped topping. Carbohydrate calculation 1. Identify the foods that contain carbohydrates: ? Rice. ? Corn. ? Milk. ? Strawberries. 2. Calculate how many servings you have of each food: ? 2 servings rice. ? 1 serving corn. ? 1 serving milk. ? 1 serving strawberries. 3. Multiply each number of servings by 15 g: ? 2 servings rice x 15 g = 30 g. ? 1 serving corn x 15 g = 15 g. ? 1 serving milk x 15 g = 15 g. ? 1 serving strawberries x 15 g = 15 g. 4. Add together all of the amounts to find the total grams of carbohydrates eaten: ? 30 g + 15 g + 15 g + 15 g = 75 g of carbohydrates total. This information is not intended to replace advice given to you by your health care provider. Make sure you discuss any questions you have with your health care provider. Document Released: 11/24/2005 Document Revised: 06/13/2016 Document Reviewed: 05/07/2016 Elsevier Interactive Patient Education  [employer].

## 2017-09-03 ENCOUNTER — Encounter: Payer: Self-pay | Admitting: Women's Health

## 2019-07-12 ENCOUNTER — Other Ambulatory Visit: Payer: Self-pay | Admitting: Orthopedic Surgery

## 2019-07-12 ENCOUNTER — Other Ambulatory Visit (HOSPITAL_COMMUNITY)
Admission: RE | Admit: 2019-07-12 | Discharge: 2019-07-12 | Disposition: A | Discharge status: Home / Self Care | Payer: BC Managed Care – PPO | Source: Ambulatory Visit | Attending: Orthopedic Surgery | Admitting: Orthopedic Surgery

## 2019-07-12 DIAGNOSIS — Z01812 Encounter for preprocedural laboratory examination: Secondary | ICD-10-CM | POA: Insufficient documentation

## 2019-07-12 DIAGNOSIS — Z20828 Contact with and (suspected) exposure to other viral communicable diseases: Secondary | ICD-10-CM | POA: Insufficient documentation

## 2019-07-12 LAB — SARS CORONAVIRUS 2 (TAT 6-24 HRS): SARS Coronavirus 2: NEGATIVE

## 2019-07-14 ENCOUNTER — Encounter (HOSPITAL_BASED_OUTPATIENT_CLINIC_OR_DEPARTMENT_OTHER): Payer: Self-pay | Admitting: *Deleted

## 2019-07-14 ENCOUNTER — Other Ambulatory Visit: Payer: Self-pay

## 2019-07-14 DIAGNOSIS — S86019A Strain of unspecified Achilles tendon, initial encounter: Secondary | ICD-10-CM | POA: Diagnosis present

## 2019-07-14 MED ORDER — TRANEXAMIC ACID 1000 MG/10ML IV SOLN
2000.0000 mg | INTRAVENOUS | Status: DC
  Filled 2019-07-14 (×2): qty 20

## 2019-07-14 NOTE — Progress Notes (Addendum)
Spoke w/ pt via phone for pre-op interview.  Npo after mn.  Arrive at [facility].  Needs istat 8 and ekg.  Pt had covid test done 07-12-2019.  Will take cymbalta and abilify am dos w/ sips of water.  Pt verbalized understanding to do half dose ibasaglar nsulin night berfore surgery.   Pt denies cardiac or stroke s&s.   Chart to be reviewed by anesthesia, Konrad Felix PA.  Pt BMI 50.12.  ADDENDUM:  Ok to proceed per anesthesia , Konrad Felix PA.

## 2019-07-14 NOTE — H&P (Signed)
Lydia [name] is an 58 y.o. female.   Chief Complaint: Left Achilles tendon rupture  HPI: Lydia [name] is here today with complaint of left ankle pain.  The patient states that she was on a vacation to [place] when she stepped off the plane and twisted her foot and felt a pop behind her leg.  She is told that she strained her Achilles tendon.  She was treated elsewhere where she was placed in a splint and told to follow-up with her orthopedist.  She has used crutches and is in a splint today.  She denies any fevers chills night sweats or other signs of infection.  She does have diabetes and her last A1c was 8.4.  Past Medical History:  Diagnosis Date  . Anxiety   . Family history of adverse reaction to anesthesia    son--- ponv  . GERD (gastroesophageal reflux disease)   . History of echocardiogram    12-24-2014   mild LVH, ef 60-65%, G2DD, mild LAD  . History of kidney stones   . History of transient ischemic attack (TIA)    03/ 2014  x1  and poss. TIA 01/ 2016 per discharge note (per pt no residuals)  . Hypertension   . MDD (major depressive disorder)   . Mild carotid artery disease ([place])    per duppler 12-24-2014  bilateral ICA 1-39%  . Mild persistent asthma    followed by pcp  . Mixed hyperlipidemia   . Osteoarthritis   . PCOS (polycystic ovarian syndrome)   . PONV (postoperative nausea and vomiting)   . Rupture of left Achilles tendon   . Type 2 diabetes mellitus treated with insulin ([place])    followed by pcp  . Uterine fibroid   . Wears glasses     Past Surgical History:  Procedure Laterality Date  . CESAREAN SECTION  2001  . COLONOSCOPY W/ BIOPSIES  2015  . CYSTOSCOPY W/ URETERAL STENT PLACEMENT Right 04/29/2015   Procedure: CYSTOSCOPY WITH RETROGRADE PYELOGRAM/URETERAL STENT PLACEMENT;  Surgeon: Alexis Frock, MD;  Location: [facility];  Service: Urology;  Laterality: Right;  . CYSTOSCOPY WITH RETROGRADE PYELOGRAM, URETEROSCOPY AND STENT PLACEMENT Right 05/30/2015   Procedure:  CYSTOSCOPY WITH RETROGRADE PYELOGRAM, URETEROSCOPY AND STENT EXCHANGE;  Surgeon: Alexis Frock, MD;  Location: [facility];  Service: Urology;  Laterality: Right;  . HOLMIUM LASER APPLICATION Right 4/54/0981   Procedure: HOLMIUM LASER APPLICATION;  Surgeon: Alexis Frock, MD;  Location: [facility];  Service: Urology;  Laterality: Right;  . TONSILLECTOMY  child  . TOTAL KNEE ARTHROPLASTY Left 07/02/2015  . TOTAL KNEE ARTHROPLASTY Left 07/02/2015   Procedure: TOTAL KNEE ARTHROPLASTY;  Surgeon: Frederik Pear, MD;  Location: [facility];  Service: Orthopedics;  Laterality: Left;  LEFT TOTAL KNEE ARTHROPLASTY DEPUY ATTUNE  . UMBILICAL HERNIA REPAIR  05-26-2016  @[facility]  [place]  . UPPER GI ENDOSCOPY      Family History  Problem Relation Age of Onset  . Hypertension Father   . Cancer Father        PROSTATE  . Diabetes Paternal Aunt   . Hypertension Paternal Aunt   . Cancer Paternal Aunt        pancreatic  . Diabetes Paternal Grandmother   . Cancer Paternal Grandmother        leukemia   Social History:  reports that she has been smoking cigarettes. She has a 1.50 pack-year smoking history. She has never used smokeless tobacco. She reports current alcohol use. No history on file for drug.  Allergies:  Allergies  Allergen Reactions  . Adhesive [Tape] Rash    EKG tape    No medications prior to admission.    Results for orders placed or performed during the hospital encounter of 07/12/19 (from the past 48 hour(s))  SARS CORONAVIRUS 2 Nasal Swab Aptima Multi Swab     Status: None   Collection Time: 07/12/19  1:05 PM   Specimen: Aptima Multi Swab; Nasal Swab  Result Value Ref Range   SARS Coronavirus 2 NEGATIVE NEGATIVE    Comment: (NOTE) SARS-CoV-2 target nucleic acids are NOT DETECTED. The SARS-CoV-2 RNA is generally detectable in upper and lower respiratory specimens during the acute phase of infection. Negative results do not preclude SARS-CoV-2 infection, do not rule out co-infections  with other pathogens, and should not be used as the sole basis for treatment or other patient management decisions. Negative results must be combined with clinical observations, patient history, and epidemiological information. The expected result is Negative. Fact Sheet for Patients: SugarRoll.be Fact Sheet for Healthcare Providers: https://www.woods-mathews.com/ This test is not yet approved or cleared by the [place] FDA and  has been authorized for detection and/or diagnosis of SARS-CoV-2 by FDA under an Emergency Use Authorization (EUA). This EUA will remain  in effect (meaning this test can be used) for the duration of the COVID-19 declaration under Section 56 4(b)(1) of the Act, 21 U.S.C. section 360bbb-3(b)(1), unless the authorization is terminated or revoked sooner. Performed at [facility], [place] [address] [place], [place] [address]    No results found.  Review of Systems  Constitutional: Negative.   HENT: Negative.   Eyes: Negative.   Respiratory: Negative.   Cardiovascular: Negative.   Gastrointestinal: Negative.   Genitourinary: Negative.   Musculoskeletal: Negative.   Skin: Negative.   Neurological: Negative.   Endo/Heme/Allergies: Negative.   Psychiatric/Behavioral: Negative.     Height 5\' 4"  (1.626 m), weight 132.5 kg, last menstrual period 09/07/2012. Physical Exam  Constitutional: She is oriented to person, place, and time. She appears well-developed and well-nourished.  HENT:  Head: Normocephalic and atraumatic.  Neck: Normal range of motion. Neck supple.  Cardiovascular: Intact distal pulses.  Respiratory: Effort normal.  Musculoskeletal:        General: Tenderness and deformity present.     Comments: the patient has no ability to actively plantar flex her foot on the left side.  She does have a palpable divot just above the calcaneus consistent with a rupture of the Achilles tendon.  Minimal pain  with palpation of the calf.  She has brisk capillary refill and is neurovascularly intact distally.  Neurological: She is alert and oriented to person, place, and time.  Skin: Skin is warm and dry.  Psychiatric: She has a normal mood and affect. Her behavior is normal. Judgment and thought content normal.     Assessment/Plan Assess: Left Achilles tendon rupture a proximally 3 weeks prior  Plan: Treatment options are discussed with the patient.  This patient is also discussed with Dr. Mayer Camel.  Today, she is given a prescription for a Cam Walker boot that is articulating.  She will need to get this from a biotech.  She is placed back in her splint and instructed use crutches.  She is asked to use rest ice and over-the-counter medications as needed for relief.  We have discussed Achilles tendon repair with the patient.  We have made her aware the benefits risks and potential complications of surgery.  These risks include but are not  limited to infection, blood clots, difficulty with anesthesia, and damage to bone tendon and nerve and soft tissues.  She wishes to proceed and a posting slip is completed.  Hopefully we can get to this by the end of the week.  This will need to be done at [facility] long outpatient as she does have elevated blood sugars.  She will be in a [place] for at least 6 weeks following her surgery and she understands that she will need to alter her weightbearing status to the use of crutches or a walker.  Joanell Rising, PA-C 07/14/2019, 1:03 PM

## 2019-07-15 ENCOUNTER — Ambulatory Visit (HOSPITAL_BASED_OUTPATIENT_CLINIC_OR_DEPARTMENT_OTHER): Payer: BC Managed Care – PPO | Admitting: Anesthesiology

## 2019-07-15 ENCOUNTER — Other Ambulatory Visit: Payer: Self-pay

## 2019-07-15 ENCOUNTER — Ambulatory Visit (HOSPITAL_BASED_OUTPATIENT_CLINIC_OR_DEPARTMENT_OTHER)
Admission: RE | Admit: 2019-07-15 | Discharge: 2019-07-15 | Disposition: A | Discharge status: Home / Self Care | Payer: BC Managed Care – PPO | Attending: Orthopedic Surgery | Admitting: Orthopedic Surgery

## 2019-07-15 ENCOUNTER — Encounter (HOSPITAL_BASED_OUTPATIENT_CLINIC_OR_DEPARTMENT_OTHER): Payer: Self-pay | Admitting: Emergency Medicine

## 2019-07-15 ENCOUNTER — Encounter (HOSPITAL_BASED_OUTPATIENT_CLINIC_OR_DEPARTMENT_OTHER)
Admission: RE | Disposition: A | Discharge status: Home / Self Care | Payer: Self-pay | Source: Home / Self Care | Attending: Orthopedic Surgery

## 2019-07-15 DIAGNOSIS — K219 Gastro-esophageal reflux disease without esophagitis: Secondary | ICD-10-CM | POA: Diagnosis not present

## 2019-07-15 DIAGNOSIS — I10 Essential (primary) hypertension: Secondary | ICD-10-CM | POA: Diagnosis not present

## 2019-07-15 DIAGNOSIS — J4530 Mild persistent asthma, uncomplicated: Secondary | ICD-10-CM | POA: Insufficient documentation

## 2019-07-15 DIAGNOSIS — E782 Mixed hyperlipidemia: Secondary | ICD-10-CM | POA: Diagnosis not present

## 2019-07-15 DIAGNOSIS — F419 Anxiety disorder, unspecified: Secondary | ICD-10-CM | POA: Insufficient documentation

## 2019-07-15 DIAGNOSIS — Z794 Long term (current) use of insulin: Secondary | ICD-10-CM | POA: Insufficient documentation

## 2019-07-15 DIAGNOSIS — Z96652 Presence of left artificial knee joint: Secondary | ICD-10-CM | POA: Diagnosis not present

## 2019-07-15 DIAGNOSIS — E1151 Type 2 diabetes mellitus with diabetic peripheral angiopathy without gangrene: Secondary | ICD-10-CM | POA: Insufficient documentation

## 2019-07-15 DIAGNOSIS — F329 Major depressive disorder, single episode, unspecified: Secondary | ICD-10-CM | POA: Diagnosis not present

## 2019-07-15 DIAGNOSIS — X501XXA Overexertion from prolonged static or awkward postures, initial encounter: Secondary | ICD-10-CM | POA: Diagnosis not present

## 2019-07-15 DIAGNOSIS — Z79899 Other long term (current) drug therapy: Secondary | ICD-10-CM | POA: Insufficient documentation

## 2019-07-15 DIAGNOSIS — F17210 Nicotine dependence, cigarettes, uncomplicated: Secondary | ICD-10-CM | POA: Insufficient documentation

## 2019-07-15 DIAGNOSIS — S86019A Strain of unspecified Achilles tendon, initial encounter: Secondary | ICD-10-CM

## 2019-07-15 DIAGNOSIS — S86012A Strain of left Achilles tendon, initial encounter: Secondary | ICD-10-CM | POA: Insufficient documentation

## 2019-07-15 DIAGNOSIS — Z8673 Personal history of transient ischemic attack (TIA), and cerebral infarction without residual deficits: Secondary | ICD-10-CM | POA: Diagnosis not present

## 2019-07-15 HISTORY — DX: Presence of spectacles and contact lenses: Z97.3

## 2019-07-15 HISTORY — PX: TENDON REPAIR: SHX5111

## 2019-07-15 HISTORY — DX: Other specified postprocedural states: Z98.890

## 2019-07-15 HISTORY — DX: Leiomyoma of uterus, unspecified: D25.9

## 2019-07-15 HISTORY — DX: Personal history of other medical treatment: Z92.89

## 2019-07-15 HISTORY — DX: Major depressive disorder, single episode, unspecified: F32.9

## 2019-07-15 HISTORY — DX: Strain of left Achilles tendon, initial encounter: S86.012A

## 2019-07-15 HISTORY — DX: Other specified postprocedural states: R11.2

## 2019-07-15 HISTORY — DX: Personal history of transient ischemic attack (TIA), and cerebral infarction without residual deficits: Z86.73

## 2019-07-15 HISTORY — DX: Disorder of arteries and arterioles, unspecified: I77.9

## 2019-07-15 HISTORY — DX: Mixed hyperlipidemia: E78.2

## 2019-07-15 HISTORY — DX: Type 2 diabetes mellitus without complications: E11.9

## 2019-07-15 HISTORY — DX: Mild persistent asthma, uncomplicated: J45.30

## 2019-07-15 HISTORY — DX: Personal history of urinary calculi: Z87.442

## 2019-07-15 LAB — POCT I-STAT, CHEM 8
BUN: 23 mg/dL — ABNORMAL HIGH (ref 6–20)
Calcium, Ion: 1.19 mmol/L (ref 1.15–1.40)
Chloride: 99 mmol/L (ref 98–111)
Creatinine, Ser: 0.7 mg/dL (ref 0.44–1.00)
Glucose, Bld: 225 mg/dL — ABNORMAL HIGH (ref 70–99)
HCT: 43 % (ref 36.0–46.0)
Hemoglobin: 14.6 g/dL (ref 12.0–15.0)
Potassium: 3.5 mmol/L (ref 3.5–5.1)
Sodium: 138 mmol/L (ref 135–145)
TCO2: 27 mmol/L (ref 22–32)

## 2019-07-15 LAB — GLUCOSE, CAPILLARY
Glucose-Capillary: 237 mg/dL — ABNORMAL HIGH (ref 70–99)
Glucose-Capillary: 192 mg/dL — ABNORMAL HIGH (ref 70–99)

## 2019-07-15 SURGERY — TENDON REPAIR
Anesthesia: General | Laterality: Left

## 2019-07-15 MED ORDER — MIDAZOLAM HCL 2 MG/2ML IJ SOLN
2.0000 mg | Freq: Once | INTRAMUSCULAR | Status: AC
  Administered 2019-07-15: 2 mg via INTRAVENOUS
  Filled 2019-07-15: qty 2

## 2019-07-15 MED ORDER — MIDAZOLAM HCL 2 MG/2ML IJ SOLN
INTRAMUSCULAR | Status: DC | PRN
  Administered 2019-07-15: 2 mg via INTRAVENOUS

## 2019-07-15 MED ORDER — FENTANYL CITRATE (PF) 100 MCG/2ML IJ SOLN
25.0000 ug | INTRAMUSCULAR | Status: DC | PRN
  Filled 2019-07-15: qty 1

## 2019-07-15 MED ORDER — INSULIN ASPART 100 UNIT/ML ~~LOC~~ SOLN
SUBCUTANEOUS | Status: AC
  Filled 2019-07-15: qty 1

## 2019-07-15 MED ORDER — MIDAZOLAM HCL 2 MG/2ML IJ SOLN
INTRAMUSCULAR | Status: AC
  Filled 2019-07-15: qty 2

## 2019-07-15 MED ORDER — SCOPOLAMINE 1 MG/3DAYS TD PT72
1.0000 | MEDICATED_PATCH | Freq: Once | TRANSDERMAL | Status: DC
  Administered 2019-07-15: 1.5 mg via TRANSDERMAL
  Filled 2019-07-15: qty 1

## 2019-07-15 MED ORDER — ONDANSETRON HCL 4 MG/2ML IJ SOLN
4.0000 mg | Freq: Once | INTRAMUSCULAR | Status: DC | PRN
  Filled 2019-07-15: qty 2

## 2019-07-15 MED ORDER — SUGAMMADEX SODIUM 200 MG/2ML IV SOLN
INTRAVENOUS | Status: DC | PRN
  Administered 2019-07-15: 400 mg via INTRAVENOUS

## 2019-07-15 MED ORDER — FENTANYL CITRATE (PF) 100 MCG/2ML IJ SOLN
INTRAMUSCULAR | Status: AC
  Filled 2019-07-15: qty 2

## 2019-07-15 MED ORDER — PHENYLEPHRINE 40 MCG/ML (10ML) SYRINGE FOR IV PUSH (FOR BLOOD PRESSURE SUPPORT)
PREFILLED_SYRINGE | INTRAVENOUS | Status: AC
  Filled 2019-07-15: qty 10

## 2019-07-15 MED ORDER — ACETAMINOPHEN 500 MG PO TABS
1000.0000 mg | ORAL_TABLET | Freq: Once | ORAL | Status: AC
  Administered 2019-07-15: 1000 mg via ORAL
  Filled 2019-07-15: qty 2

## 2019-07-15 MED ORDER — INSULIN ASPART 100 UNIT/ML ~~LOC~~ SOLN
5.0000 [IU] | Freq: Once | SUBCUTANEOUS | Status: AC
  Administered 2019-07-15: 5 [IU] via INTRAVENOUS
  Filled 2019-07-15: qty 0.05

## 2019-07-15 MED ORDER — ONDANSETRON HCL 4 MG/2ML IJ SOLN
4.0000 mg | Freq: Four times a day (QID) | INTRAMUSCULAR | Status: DC | PRN
  Filled 2019-07-15: qty 2

## 2019-07-15 MED ORDER — ONDANSETRON HCL 4 MG/2ML IJ SOLN
INTRAMUSCULAR | Status: DC | PRN
  Administered 2019-07-15: 4 mg via INTRAVENOUS

## 2019-07-15 MED ORDER — ACETAMINOPHEN 325 MG PO TABS
325.0000 mg | ORAL_TABLET | Freq: Four times a day (QID) | ORAL | Status: DC | PRN
  Filled 2019-07-15: qty 2

## 2019-07-15 MED ORDER — ONDANSETRON HCL 4 MG PO TABS
4.0000 mg | ORAL_TABLET | Freq: Four times a day (QID) | ORAL | Status: DC | PRN
  Filled 2019-07-15: qty 1

## 2019-07-15 MED ORDER — CLONIDINE HCL (ANALGESIA) 100 MCG/ML EP SOLN
EPIDURAL | Status: DC | PRN
  Administered 2019-07-15: 100 ug

## 2019-07-15 MED ORDER — GABAPENTIN 300 MG PO CAPS
ORAL_CAPSULE | ORAL | Status: AC
  Filled 2019-07-15: qty 1

## 2019-07-15 MED ORDER — CELECOXIB 200 MG PO CAPS
200.0000 mg | ORAL_CAPSULE | Freq: Once | ORAL | Status: AC
  Administered 2019-07-15: 09:00:00 200 mg via ORAL
  Filled 2019-07-15: qty 1

## 2019-07-15 MED ORDER — HYDROCODONE-ACETAMINOPHEN 5-325 MG PO TABS: 1.0000 | ORAL_TABLET | Freq: Four times a day (QID) | ORAL | 0 refills | Status: DC | PRN

## 2019-07-15 MED ORDER — ALBUTEROL SULFATE (2.5 MG/3ML) 0.083% IN NEBU
2.5000 mg | INHALATION_SOLUTION | Freq: Four times a day (QID) | RESPIRATORY_TRACT | Status: DC | PRN
  Filled 2019-07-15: qty 3

## 2019-07-15 MED ORDER — SCOPOLAMINE 1 MG/3DAYS TD PT72
MEDICATED_PATCH | TRANSDERMAL | Status: AC
  Filled 2019-07-15: qty 1

## 2019-07-15 MED ORDER — HYDROCODONE-ACETAMINOPHEN 5-325 MG PO TABS
1.0000 | ORAL_TABLET | ORAL | Status: DC | PRN
  Filled 2019-07-15: qty 2

## 2019-07-15 MED ORDER — LACTATED RINGERS IV SOLN
INTRAVENOUS | Status: DC
  Filled 2019-07-15: qty 1000

## 2019-07-15 MED ORDER — DEXAMETHASONE SODIUM PHOSPHATE 10 MG/ML IJ SOLN
INTRAMUSCULAR | Status: AC
  Filled 2019-07-15: qty 1

## 2019-07-15 MED ORDER — CELECOXIB 200 MG PO CAPS
ORAL_CAPSULE | ORAL | Status: AC
  Filled 2019-07-15: qty 1

## 2019-07-15 MED ORDER — CHLORHEXIDINE GLUCONATE 4 % EX LIQD
60.0000 mL | Freq: Once | CUTANEOUS | Status: DC
  Filled 2019-07-15: qty 118

## 2019-07-15 MED ORDER — BUPIVACAINE HCL (PF) 0.5 % IJ SOLN
INTRAMUSCULAR | Status: AC
  Filled 2019-07-15: qty 30

## 2019-07-15 MED ORDER — SUCCINYLCHOLINE CHLORIDE 200 MG/10ML IV SOSY
PREFILLED_SYRINGE | INTRAVENOUS | Status: DC | PRN
  Administered 2019-07-15: 100 mg via INTRAVENOUS

## 2019-07-15 MED ORDER — ALBUTEROL SULFATE HFA 108 (90 BASE) MCG/ACT IN AERS
2.0000 | INHALATION_SPRAY | Freq: Four times a day (QID) | RESPIRATORY_TRACT | Status: DC | PRN
  Filled 2019-07-15: qty 6.7

## 2019-07-15 MED ORDER — METOCLOPRAMIDE HCL 5 MG PO TABS
5.0000 mg | ORAL_TABLET | Freq: Three times a day (TID) | ORAL | Status: DC | PRN
  Filled 2019-07-15: qty 2

## 2019-07-15 MED ORDER — ROCURONIUM BROMIDE 10 MG/ML (PF) SYRINGE
PREFILLED_SYRINGE | INTRAVENOUS | Status: DC | PRN
  Administered 2019-07-15: 40 mg via INTRAVENOUS

## 2019-07-15 MED ORDER — CEFAZOLIN SODIUM-DEXTROSE 2-4 GM/100ML-% IV SOLN
2.0000 g | INTRAVENOUS | Status: AC
  Administered 2019-07-15: 3 g via INTRAVENOUS
  Filled 2019-07-15: qty 100

## 2019-07-15 MED ORDER — PROPOFOL 10 MG/ML IV BOLUS
INTRAVENOUS | Status: AC
  Filled 2019-07-15: qty 20

## 2019-07-15 MED ORDER — LIDOCAINE 2% (20 MG/ML) 5 ML SYRINGE
INTRAMUSCULAR | Status: DC | PRN
  Administered 2019-07-15: 100 mg via INTRAVENOUS

## 2019-07-15 MED ORDER — PHENYLEPHRINE HCL (PRESSORS) 10 MG/ML IV SOLN
INTRAVENOUS | Status: DC | PRN
  Administered 2019-07-15: 160 ug via INTRAVENOUS
  Administered 2019-07-15: 80 ug via INTRAVENOUS
  Administered 2019-07-15: 120 ug via INTRAVENOUS
  Administered 2019-07-15: 80 ug via INTRAVENOUS
  Administered 2019-07-15 (×3): 120 ug via INTRAVENOUS

## 2019-07-15 MED ORDER — DEXAMETHASONE SODIUM PHOSPHATE 10 MG/ML IJ SOLN
INTRAMUSCULAR | Status: DC | PRN
  Administered 2019-07-15: 5 mg via INTRAVENOUS

## 2019-07-15 MED ORDER — ROCURONIUM BROMIDE 10 MG/ML (PF) SYRINGE: PREFILLED_SYRINGE | INTRAVENOUS | Status: DC | PRN

## 2019-07-15 MED ORDER — CEFAZOLIN SODIUM-DEXTROSE 1-4 GM/50ML-% IV SOLN
INTRAVENOUS | Status: AC
  Filled 2019-07-15: qty 50

## 2019-07-15 MED ORDER — CEFAZOLIN SODIUM-DEXTROSE 2-4 GM/100ML-% IV SOLN
INTRAVENOUS | Status: AC
  Filled 2019-07-15: qty 100

## 2019-07-15 MED ORDER — METOCLOPRAMIDE HCL 5 MG/ML IJ SOLN
5.0000 mg | Freq: Three times a day (TID) | INTRAMUSCULAR | Status: DC | PRN
  Filled 2019-07-15: qty 2

## 2019-07-15 MED ORDER — ONDANSETRON HCL 4 MG/2ML IJ SOLN
INTRAMUSCULAR | Status: AC
  Filled 2019-07-15: qty 2

## 2019-07-15 MED ORDER — LACTATED RINGERS IV SOLN
INTRAVENOUS | Status: DC
  Administered 2019-07-15 (×2): via INTRAVENOUS
  Filled 2019-07-15: qty 1000

## 2019-07-15 MED ORDER — BUPIVACAINE-EPINEPHRINE (PF) 0.5% -1:200000 IJ SOLN
INTRAMUSCULAR | Status: DC | PRN
  Administered 2019-07-15: 30 mL via PERINEURAL

## 2019-07-15 MED ORDER — PROPOFOL 10 MG/ML IV BOLUS
INTRAVENOUS | Status: DC | PRN
  Administered 2019-07-15: 20 mg via INTRAVENOUS
  Administered 2019-07-15: 250 mg via INTRAVENOUS
  Administered 2019-07-15: 50 mg via INTRAVENOUS

## 2019-07-15 MED ORDER — ACETAMINOPHEN 500 MG PO TABS
ORAL_TABLET | ORAL | Status: AC
  Filled 2019-07-15: qty 2

## 2019-07-15 MED ORDER — TRANEXAMIC ACID-NACL 1000-0.7 MG/100ML-% IV SOLN
1000.0000 mg | INTRAVENOUS | Status: DC
  Filled 2019-07-15: qty 100

## 2019-07-15 MED ORDER — FENTANYL CITRATE (PF) 100 MCG/2ML IJ SOLN
50.0000 ug | Freq: Once | INTRAMUSCULAR | Status: AC
  Administered 2019-07-15: 50 ug via INTRAVENOUS
  Filled 2019-07-15: qty 1

## 2019-07-15 MED ORDER — FENTANYL CITRATE (PF) 100 MCG/2ML IJ SOLN
INTRAMUSCULAR | Status: DC | PRN
  Administered 2019-07-15 (×2): 50 ug via INTRAVENOUS

## 2019-07-15 SURGICAL SUPPLY — 69 items
BANDAGE ESMARK 6X9 LF (GAUZE/BANDAGES/DRESSINGS) ×1 IMPLANT
BLADE SURG 15 STRL LF DISP TIS (BLADE) ×1 IMPLANT
BLADE SURG 15 STRL SS (BLADE) ×2
BNDG ELASTIC 4X5.8 VLCR STR LF (GAUZE/BANDAGES/DRESSINGS) ×3 IMPLANT
BNDG ELASTIC 6X5.8 VLCR STR LF (GAUZE/BANDAGES/DRESSINGS) ×2 IMPLANT
BNDG ESMARK 6X9 LF (GAUZE/BANDAGES/DRESSINGS) ×3
COVER BACK TABLE REUSABLE LG (DRAPES) ×3 IMPLANT
CUFF TOURN SGL QUICK 18X4 (TOURNIQUET CUFF) IMPLANT
CUFF TOURN SGL QUICK 24 (TOURNIQUET CUFF) ×2
CUFF TOURN SGL QUICK 34 (TOURNIQUET CUFF)
CUFF TRNQT CYL 24X4X16.5-23 (TOURNIQUET CUFF) IMPLANT
CUFF TRNQT CYL 34X4.125X (TOURNIQUET CUFF) IMPLANT
DECANTER SPIKE VIAL GLASS SM (MISCELLANEOUS) IMPLANT
DRAPE EXTREMITY T 121X128X90 (DISPOSABLE) ×3 IMPLANT
DRAPE IMP U-DRAPE 54X76 (DRAPES) ×3 IMPLANT
DRAPE U-SHAPE 47X51 STRL (DRAPES) ×3 IMPLANT
DURAPREP 26ML APPLICATOR (WOUND CARE) ×1 IMPLANT
ELECT REM PT RETURN 9FT ADLT (ELECTROSURGICAL) ×3
ELECTRODE REM PT RTRN 9FT ADLT (ELECTROSURGICAL) ×1 IMPLANT
GAUZE SPONGE 4X4 12PLY STRL (GAUZE/BANDAGES/DRESSINGS) ×3 IMPLANT
GAUZE SPONGE 4X4 12PLY STRL LF (GAUZE/BANDAGES/DRESSINGS) ×2 IMPLANT
GAUZE XEROFORM 1X8 LF (GAUZE/BANDAGES/DRESSINGS) ×3 IMPLANT
GLOVE BIO SURGEON STRL SZ7.5 (GLOVE) ×3 IMPLANT
GLOVE BIO SURGEON STRL SZ8.5 (GLOVE) ×3 IMPLANT
GLOVE BIOGEL PI IND STRL 8 (GLOVE) ×2 IMPLANT
GLOVE BIOGEL PI IND STRL 9 (GLOVE) ×1 IMPLANT
GLOVE BIOGEL PI INDICATOR 8 (GLOVE) ×4
GLOVE BIOGEL PI INDICATOR 9 (GLOVE) ×2
GOWN STRL REUS W/ TWL LRG LVL3 (GOWN DISPOSABLE) ×1 IMPLANT
GOWN STRL REUS W/TWL LRG LVL3 (GOWN DISPOSABLE) ×2
GOWN STRL REUS W/TWL XL LVL3 (GOWN DISPOSABLE) ×3 IMPLANT
NDL HYPO 25X1 1.5 SAFETY (NEEDLE) IMPLANT
NEEDLE HYPO 25X1 1.5 SAFETY (NEEDLE) IMPLANT
NS IRRIG 1000ML POUR BTL (IV SOLUTION) ×3 IMPLANT
PAD CAST 4YDX4 CTTN HI CHSV (CAST SUPPLIES) ×1 IMPLANT
PADDING CAST ABS 4INX4YD NS (CAST SUPPLIES)
PADDING CAST ABS COTTON 4X4 ST (CAST SUPPLIES) ×1 IMPLANT
PADDING CAST COTTON 4X4 STRL (CAST SUPPLIES) ×2
PADDING CAST SYNTHETIC 4 (CAST SUPPLIES)
PADDING CAST SYNTHETIC 4X4 STR (CAST SUPPLIES) IMPLANT
PASSER SUT SWANSON 36MM LOOP (INSTRUMENTS) IMPLANT
PENCIL BUTTON HOLSTER BLD 10FT (ELECTRODE) ×3 IMPLANT
SLEEVE SCD COMPRESS KNEE MED (MISCELLANEOUS) ×2 IMPLANT
SPONGE LAP 4X18 RFD (DISPOSABLE) ×3 IMPLANT
SUCTION FRAZIER HANDLE 10FR (MISCELLANEOUS) ×2
SUCTION TUBE FRAZIER 10FR DISP (MISCELLANEOUS) IMPLANT
SUT ETHILON 3 0 PS 1 (SUTURE) ×3 IMPLANT
SUT FIBERWIRE #2 38 REV NDL BL (SUTURE) ×12
SUT FIBERWIRE #2 38 T-5 BLUE (SUTURE) ×3
SUT FIBERWIRE #5 38 CONV NDL (SUTURE)
SUT PDS AB 4-0 P3 18 (SUTURE) IMPLANT
SUT VIC AB 1 CT1 27 (SUTURE) ×2
SUT VIC AB 1 CT1 27XBRD ANBCTR (SUTURE) ×1 IMPLANT
SUT VIC AB 2-0 CT1 27 (SUTURE) ×2
SUT VIC AB 2-0 CT1 TAPERPNT 27 (SUTURE) ×1 IMPLANT
SUT VIC AB 2-0 CTB1 (SUTURE) ×3 IMPLANT
SUT VIC AB 3-0 FS2 27 (SUTURE) IMPLANT
SUT VIC AB 3-0 SH 27 (SUTURE) ×2
SUT VIC AB 3-0 SH 27X BRD (SUTURE) ×1 IMPLANT
SUT VIC AB 3-0 X1 27 (SUTURE) IMPLANT
SUTURE FIBERWR #2 38 T-5 BLUE (SUTURE) IMPLANT
SUTURE FIBERWR #5 38 CONV NDL (SUTURE) IMPLANT
SUTURE FIBERWR#2 38 REV NDL BL (SUTURE) IMPLANT
SYR BULB 3OZ (MISCELLANEOUS) ×3 IMPLANT
SYR CONTROL 10ML LL (SYRINGE) ×2 IMPLANT
TUBE CONNECTING 12'X1/4 (SUCTIONS) ×1
TUBE CONNECTING 12X1/4 (SUCTIONS) ×1 IMPLANT
UNDERPAD 30X30 (UNDERPADS AND DIAPERS) ×3 IMPLANT
YANKAUER SUCT BULB TIP NO VENT (SUCTIONS) IMPLANT

## 2019-07-15 NOTE — Anesthesia Preprocedure Evaluation (Addendum)
Anesthesia Evaluation  Patient identified by MRN, date of birth, ID band Patient awake    Reviewed: Allergy & Precautions, NPO status , Patient's Chart, lab work & pertinent test results  History of Anesthesia Complications (+) PONV and history of anesthetic complications  Airway Mallampati: II  TM Distance: >3 FB Neck ROM: Full    Dental  (+) Teeth Intact, Dental Advisory Given   Pulmonary asthma , Current Smoker and Patient abstained from smoking.,    Pulmonary exam normal breath sounds clear to auscultation       Cardiovascular hypertension, Pt. on medications + Peripheral Vascular Disease  Normal cardiovascular exam Rhythm:Regular Rate:Normal     Neuro/Psych PSYCHIATRIC DISORDERS Anxiety Depression TIA   GI/Hepatic Neg liver ROS, GERD  Medicated and Controlled,  Endo/Other  diabetes, Type 2, Oral Hypoglycemic Agents, Insulin DependentMorbid obesity  Renal/GU Renal disease     Musculoskeletal  (+) Arthritis , Left Achilles Tendon Rupture   Abdominal   Peds  Hematology negative hematology ROS (+)   Anesthesia Other Findings Day of surgery medications reviewed with the patient.  Reproductive/Obstetrics                           Anesthesia Physical Anesthesia Plan  ASA: III  Anesthesia Plan: General   Post-op Pain Management:  Regional for Post-op pain   Induction: Intravenous  PONV Risk Score and Plan: 3 and Midazolam, Dexamethasone and Ondansetron  Airway Management Planned: LMA  Additional Equipment:   Intra-op Plan:   Post-operative Plan: Extubation in OR  Informed Consent: I have reviewed the patients History and Physical, chart, labs and discussed the procedure including the risks, benefits and alternatives for the proposed anesthesia with the patient or authorized representative who has indicated his/her understanding and acceptance.     Dental advisory given  Plan  Discussed with: CRNA  Anesthesia Plan Comments:        Anesthesia Quick Evaluation

## 2019-07-15 NOTE — Anesthesia Procedure Notes (Signed)
Procedure Name: Intubation Date/Time: 07/15/2019 9:32 AM Performed by: Catalina Gravel, MD Pre-anesthesia Checklist: Patient identified, Emergency Drugs available, Suction available and Patient being monitored Patient Re-evaluated:Patient Re-evaluated prior to induction Oxygen Delivery Method: Circle system utilized Preoxygenation: Pre-oxygenation with 100% oxygen Induction Type: IV induction Ventilation: Mask ventilation without difficulty Laryngoscope Size: Mac and 3 Grade View: Grade II Tube type: Oral Tube size: 7.0 mm Number of attempts: 1 Airway Equipment and Method: Stylet Placement Confirmation: ETT inserted through vocal cords under direct vision,  positive ETCO2 and breath sounds checked- equal and bilateral Secured at: 22 cm Tube secured with: Tape Dental Injury: Teeth and Oropharynx as per pre-operative assessment  Comments: View of cord with cricoid pressure.  Larynx deep.  Would recommend using a MAC 4 blade.  AOI. + ETCO2 and VSS.

## 2019-07-15 NOTE — Discharge Instructions (Signed)
Regional Anesthesia Blocks  1. Numbness or the inability to move the "blocked" extremity may last from 3-48 hours after placement. The length of time depends on the medication injected and your individual response to the medication. If the numbness is not going away after 48 hours, call your surgeon.  2. The extremity that is blocked will need to be protected until the numbness is gone and the  Strength has returned. Because you cannot feel it, you will need to take extra care to avoid injury. Because it may be weak, you may have difficulty moving it or using it. You may not know what position it is in without looking at it while the block is in effect.  3. For blocks in the legs and feet, returning to weight bearing and walking needs to be done carefully. You will need to wait until the numbness is entirely gone and the strength has returned. You should be able to move your leg and foot normally before you try and bear weight or walk. You will need someone to be with you when you first try to ensure you do not fall and possibly risk injury.  4. Bruising and tenderness at the needle site are common side effects and will resolve in a few days.  5. Persistent numbness or new problems with movement should be communicated to the surgeon or the [place] ([phone])/ [place] [phone])  NO ADVIL, ALEVE, MOTRIN, IBUPROFEN UNTIL 2 PM TODAY IF NEEDED  Post Anesthesia Home Care Instructions  Activity: Get plenty of rest for the remainder of the day. A responsible adult should stay with you for 24 hours following the procedure.  For the next 24 hours, DO NOT: -Drive a car -Paediatric nurse -Drink alcoholic beverages -Take any medication unless instructed by your physician -Make any legal decisions or sign important papers.  Meals: Start with liquid foods such as gelatin or soup. Progress to regular foods as tolerated. Avoid greasy, spicy, heavy foods. If nausea  and/or vomiting occur, drink only clear liquids until the nausea and/or vomiting subsides. Call your physician if vomiting continues.  Special Instructions/Symptoms: Your throat may feel dry or sore from the anesthesia or the breathing tube placed in your throat during surgery. If this causes discomfort, gargle with warm salt water. The discomfort should disappear within 24 hours.  If you had a scopolamine patch placed behind your ear for the management of post- operative nausea and/or vomiting:  1. The medication in the patch is effective for 72 hours, after which it should be removed.  Wrap patch in a tissue and discard in the trash. Wash hands thoroughly with soap and water. 2. You may remove the patch earlier than 72 hours if you experience unpleasant side effects which may include dry mouth, dizziness or visual disturbances. 3. Avoid touching the patch. Wash your hands with soap and water after contact with the patch.

## 2019-07-15 NOTE — Anesthesia Procedure Notes (Signed)
Anesthesia Regional Block: Popliteal block   Pre-Anesthetic Checklist: ,, timeout performed, Correct Patient, Correct Site, Correct Laterality, Correct Procedure, Correct Position, site marked, Risks and benefits discussed,  Surgical consent,  Pre-op evaluation,  At surgeon's request and post-op pain management  Laterality: Left  Prep: chloraprep       Needles:  Injection technique: Single-shot  Needle Type: Echogenic Needle     Needle Length: 9cm  Needle Gauge: 21     Additional Needles:   Procedures:,,,, ultrasound used (permanent image in chart),,,,  Narrative:  Start time: 07/15/2019 8:20 AM End time: 07/15/2019 8:30 AM Injection made incrementally with aspirations every 5 mL.  Performed by: Personally  Anesthesiologist: Catalina Gravel, MD  Additional Notes: No pain on injection. No increased resistance to injection. Injection made in 5cc increments.  Good needle visualization.  Patient tolerated procedure well.

## 2019-07-15 NOTE — Transfer of Care (Signed)
   Last Vitals:  Vitals Value Taken Time  BP 138/78 07/15/19 1031  Temp    Pulse 96 07/15/19 1033  Resp 15 07/15/19 1033  SpO2 100 % 07/15/19 1033  Vitals shown include unvalidated device data.  Last Pain:  Vitals:   07/15/19 0746  TempSrc: Oral      Patients Stated Pain Goal: 5 (07/15/19 0746)  Immediate Anesthesia Transfer of Care Note  Patient: Lydia [name]  Procedure(s) Performed: Procedure(s) (LRB): Left Achilles Tendon Repair (Left)  Patient Location: PACU  Anesthesia Type: General  Level of Consciousness: drowsy  Airway & Oxygen Therapy: Patient Spontanous Breathing and Patient connected to face mask oxygen  Post-op Assessment: Report given to PACU RN and Post -op Vital signs reviewed and stable  Post vital signs: Reviewed and stable  Complications: No apparent anesthesia complications

## 2019-07-15 NOTE — Interval H&P Note (Signed)
History and Physical Interval Note:  07/15/2019 8:33 AM  Lydia [name]  has presented today for surgery, with the diagnosis of Left Achilles Tendon Rupture.  The various methods of treatment have been discussed with the patient and family. After consideration of risks, benefits and other options for treatment, the patient has consented to  Procedure(s): Left Achilles Tendon Repair (Left) as a surgical intervention.  The patient's history has been reviewed, patient examined, no change in status, stable for surgery.  I have reviewed the patient's chart and labs.  Questions were answered to the patient's satisfaction.     [name]

## 2019-07-15 NOTE — Progress Notes (Signed)
Assisted Dr. Turk with left, ultrasound guided, popliteal block. Side rails up, monitors on throughout procedure. See vital signs in flow sheet. Tolerated Procedure well. 

## 2019-07-15 NOTE — Op Note (Signed)
Pre Op Dx: L Achilles tendon rupture  Post Op Dx: Same  Procedure: L Achilles tendon repair, using #2 FiberWire core sutures x4  Surgeon: [name], MD  Assistant: Kerry Hough. Barton Dubois  (present throughout entire procedure and necessary for timely completion of the procedure)  Anesthesia: General, popliteal block  Antibiotics: Ancef 3 g  EBL: Minimal  Fluids: 800 cc crystalloid  Tourniquet Time: 30 minutes  Indications: Patient was on vacation in [place] 3 weeks ago stumbled while walking felt a loud pop in the left ankle and sustained a left Achilles tendon rupture.  She was seen at an urgent care and told she had a calf strain.  When she returned to [place] she self isolated for 2 weeks because of the pandemic rules and came into our office 2 days ago where we diagnosed the Achilles tendon rupture by physical examination with a gap just above the calcaneus on the left side and no pushoff power.  The patient has some significant comorbidities including diabetes with sugars running around 225, BMI over 40, hypertension, history of kidney stones, history of TIA, in order to decrease pain and increase function we have recommended open repair of the left Achilles tendon.  The risks and benefits of surgery been discussed and all questions answered.  Postoperatively she will be in an articulated cam walker boot plantar flex 30 degrees.  Procedure: The patient identified by arm band and received 3 g Ancef in the holding area at the day surgery center. He was then taken to the operating room where the appropriate anesthetic monitors were attached and general endotracheal anesthesia induced while he was supine in the gurney. He was then placed in the prone position on the operating table with well-padded chest rolls. Tourniquet was applied to the calf and the left lower extremity was prepped and draped in the usual sterile fashion from the toes to the tourniquet. Timeout procedure was  performed, 1 wrapped with an Esmarch bandage and the tourniquet inflated to 250 mm of mercury. A 6 cm incision centered over the Achilles tendon rupture defect was made through the skin and subcutaneous into the sheath of the Achilles tendon. The ends of the ruptured tendon were positively identified and trimmed back to healthy tissue and then each stump had a #2 FiberWire whip stitch running for 4 throws up the medial side and 4 throws down the lateral side so there were 2 central core sutures on each stump. The knee was flexed and the foot plantar flexed allowing [place] to over reduce the tendon as the core sutures were tied and then run proximally and distally with a whipstitch one more time and again tied giving [place] for core sutures. With the knee in extension the foot could be brought to neutral with ring of the tendon whatsoever. The tourniquet was let down small bleeders identified and cauterized the tendon sheath and subcutaneous tissue are closed running 3-0 Vicryl suture and the skin with running subcuticular 3-0 Vicryl suture. A dressing of Xeroform 4 x 4 dressings on his web roll Ace wrap and a Cam Walker boot articulated and plantar flex to 30 was applied. The patient was rolled supine awakened extubated and taken to the recovery without difficulty.

## 2019-07-15 NOTE — Anesthesia Postprocedure Evaluation (Signed)
Anesthesia Post Note  Patient: Lydia [name]  Procedure(s) Performed: Left Achilles Tendon Repair (Left )     Patient location during evaluation: PACU Anesthesia Type: General Level of consciousness: awake and alert, awake and oriented Pain management: pain level controlled Vital Signs Assessment: post-procedure vital signs reviewed and stable Respiratory status: spontaneous breathing, nonlabored ventilation, respiratory function stable and patient connected to nasal cannula oxygen Cardiovascular status: blood pressure returned to baseline and stable Postop Assessment: no apparent nausea or vomiting Anesthetic complications: no    Last Vitals:  Vitals:   07/15/19 1320 07/15/19 1335  BP:    Pulse:    Resp:    Temp:    SpO2: 94% 94%    Last Pain:  Vitals:   07/15/19 1320  TempSrc:   PainSc: 0-No pain                 Catalina Gravel

## 2019-07-18 ENCOUNTER — Encounter (HOSPITAL_BASED_OUTPATIENT_CLINIC_OR_DEPARTMENT_OTHER): Payer: Self-pay | Admitting: Orthopedic Surgery

## 2019-12-28 ENCOUNTER — Other Ambulatory Visit: Payer: Self-pay | Admitting: Orthopedic Surgery

## 2019-12-28 DIAGNOSIS — M7661 Achilles tendinitis, right leg: Secondary | ICD-10-CM

## 2020-01-03 ENCOUNTER — Ambulatory Visit
Admission: RE | Admit: 2020-01-03 | Discharge: 2020-01-03 | Disposition: A | Discharge status: Home / Self Care | Payer: BC Managed Care – PPO | Source: Ambulatory Visit | Attending: Orthopedic Surgery | Admitting: Orthopedic Surgery

## 2020-01-03 ENCOUNTER — Other Ambulatory Visit: Payer: Self-pay

## 2020-01-03 DIAGNOSIS — M7661 Achilles tendinitis, right leg: Secondary | ICD-10-CM

## 2020-05-16 ENCOUNTER — Encounter: Payer: BC Managed Care – PPO | Admitting: Nurse Practitioner

## 2020-10-06 IMAGING — MR MR ANKLE*R* W/O CM
5 series · 38 of 40 positions shown · non-contrast
Comparison: None.

CLINICAL DATA: Posterior ankle pain.

EXAM:
MRI OF THE RIGHT ANKLE WITHOUT CONTRAST
TECHNIQUE: Multiplanar, multisequence MR imaging of the ankle was performed. No
intravenous contrast was administered.

[Series 4: T2 fat-sat · axial · 3.0mm · 0.50mm/px · z∈[-97,+23]mm · 9 of 32 slices shown (1 of 2)]
[im 1/32]
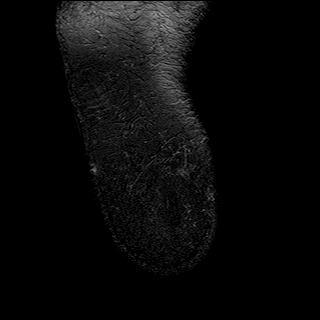
[im 4/32]
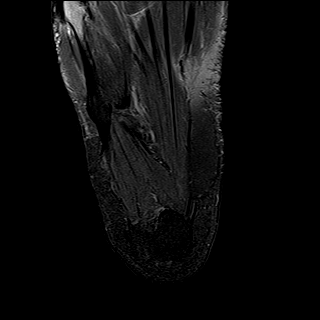
[im 8/32]
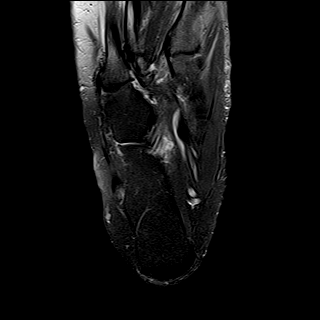
[im 12/32]
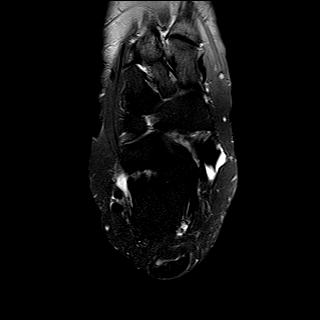
[im 16/32]
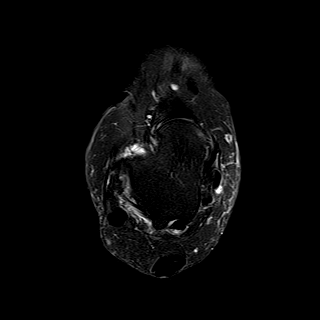
[im 20/32]
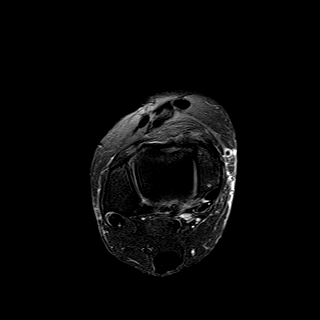
[im 24/32]
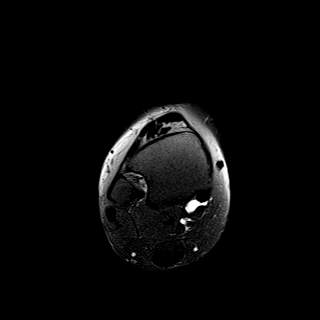
[im 28/32]
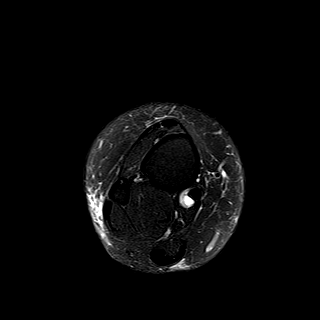
[im 32/32]
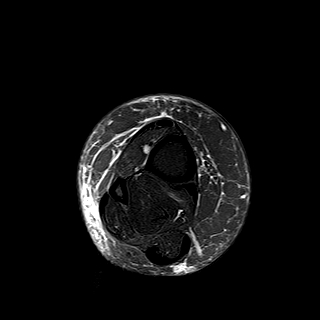

[Series 5: T1 · axial · 3.0mm · 0.50mm/px · z∈[-96,+24]mm · 9 of 32 slices shown (1 of 2)]
[im 1/32]
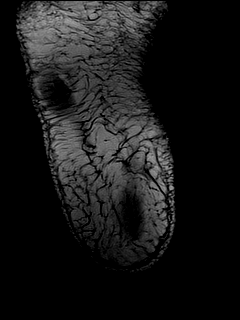
[im 4/32]
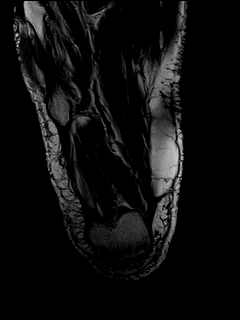
[im 8/32]
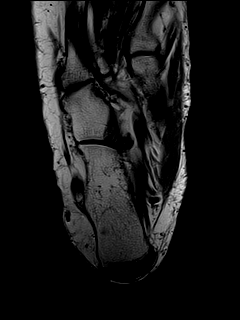
[im 12/32]
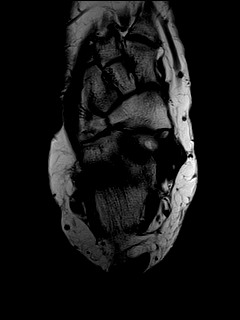
[im 16/32]
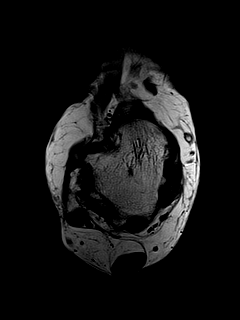
[im 20/32]
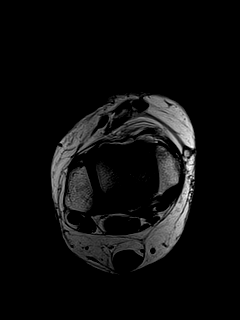
[im 24/32]
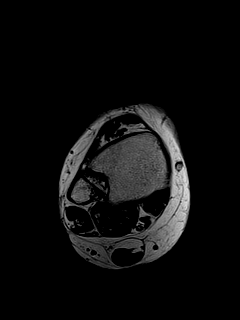
[im 28/32]
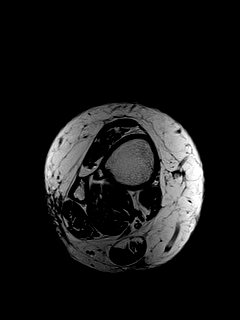
[im 32/32]
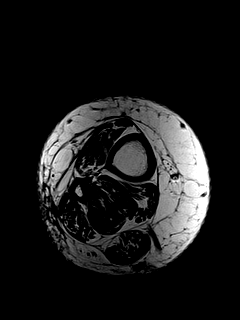

[Series 6: T1 · sagittal · 4.0mm · 0.56mm/px · 6 of 20 slices shown (2 of 2)]
[im 1/20]
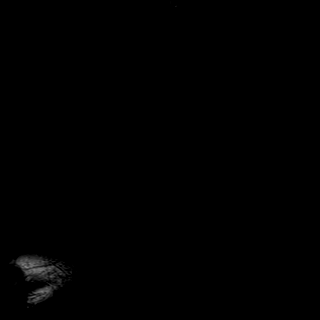
[im 4/20]
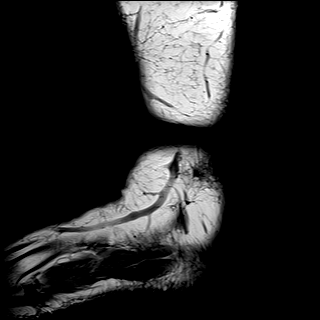
[im 8/20]
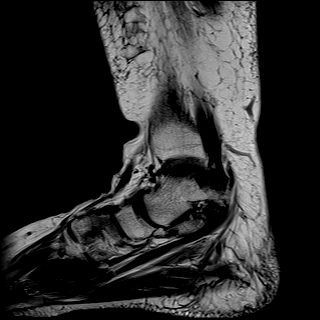
[im 12/20]
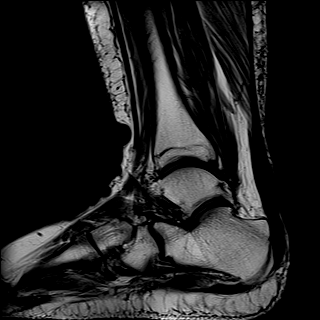
[im 16/20]
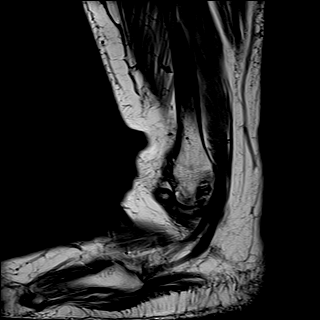
[im 20/20]
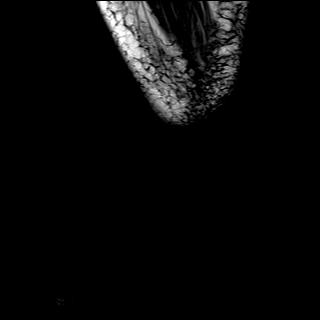

[Series 7: STIR · sagittal · 4.0mm · 0.35mm/px · 6 of 19 slices shown]
[im 1/19]
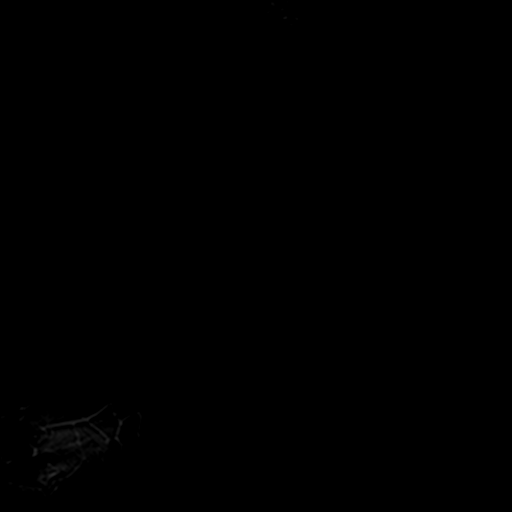
[im 4/19]
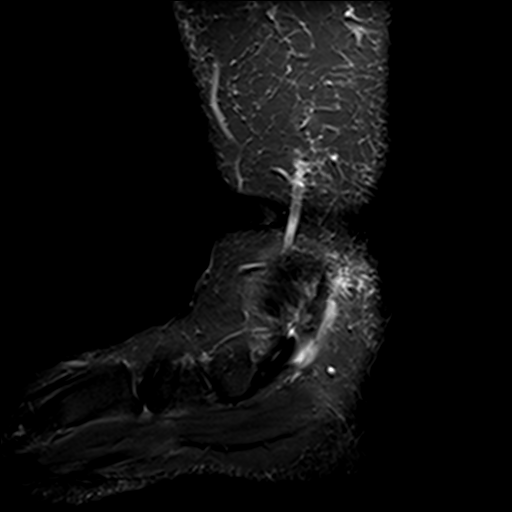
[im 8/19]
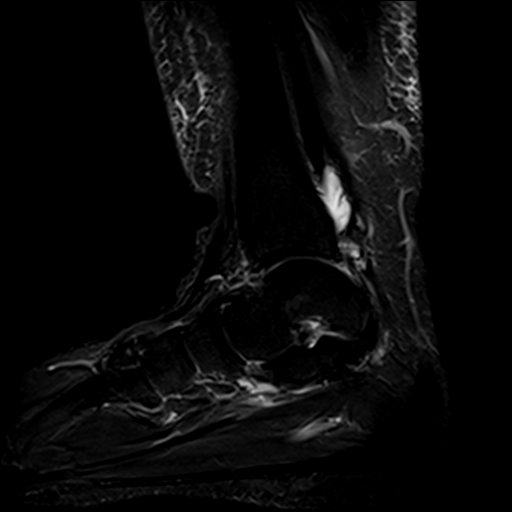
[im 11/19]
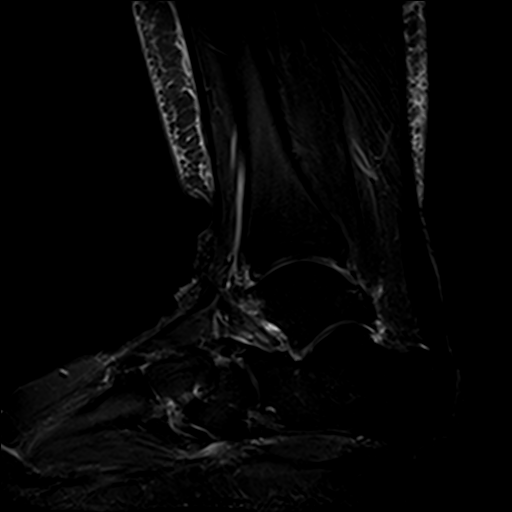
[im 15/19]
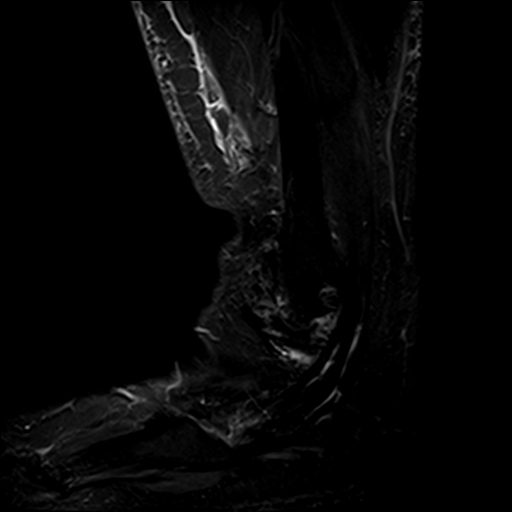
[im 19/19]
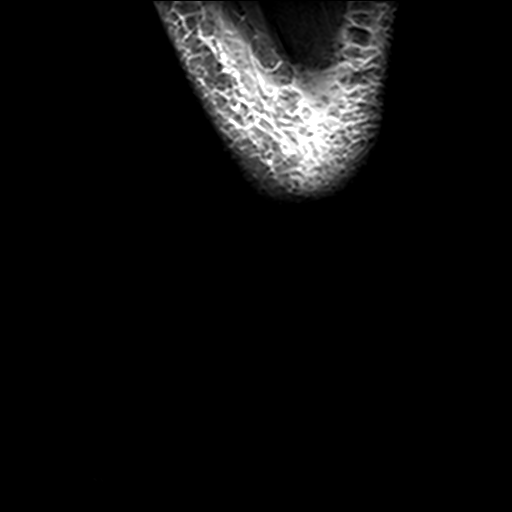

[Series 8: T2 fat-sat · coronal · 3.0mm · 0.50mm/px · 8 of 35 slices shown (2 of 2)]
[im 1/35]
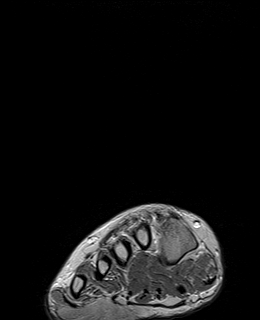
[im 4/35]
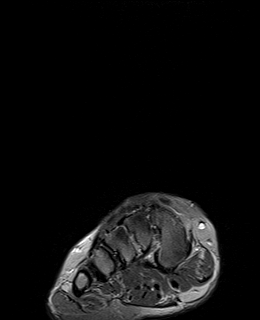
[im 12/35]
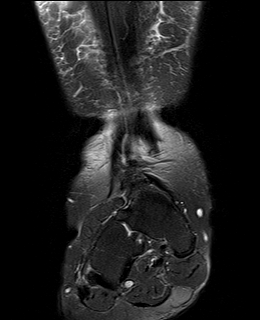
[im 16/35]
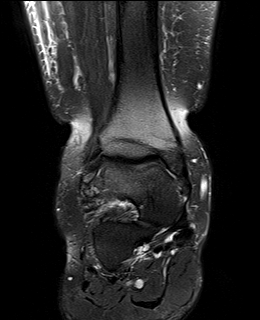
[im 19/35]
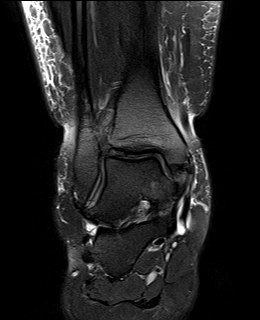
[im 23/35]
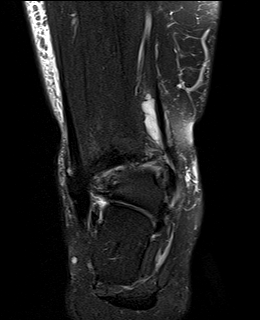
[im 31/35]
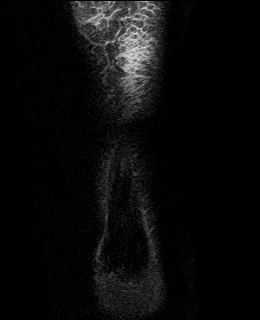
[im 35/35]
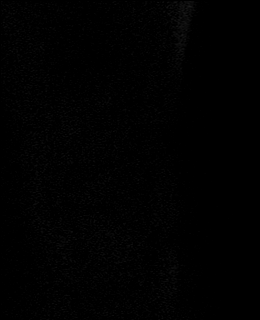

[38 of 40 positions shown; findings below may reference images not displayed]

FINDINGS: TENDONS

Peroneal: Intact.

Posteromedial: Intact. Moderate tenosynovitis involving the
posterior tibialis tendon.

Anterior: Intact.

Achilles: Moderate chronic appearing Achilles tendinopathy. The
tendon is thickened in demonstrates small interstitial tears mainly
distally. No partial or full-thickness tear/rupture is identified.

Plantar Fascia: Moderate thickening of the central band and some
increased intrasubstance T2 signal intensity which may suggest
changes of remote plantar fasciitis or chronic plantar fasciitis. No
tear.

LIGAMENTS

Lateral: Evidence of remote trauma involving the anterior
talofibular ligament with an old avulsion fracture from the distal
fibula. There is also significant spurring changes off the adjacent
talus. The anterior and posterior tibiofibular ligaments are intact.

Medial: Evidence of remote trauma with an old fracture involving the
medial malleolus. Probable remote deltoid ligament injury but no
acute tear.

CARTILAGE

Ankle Joint: Age advanced degenerative changes, likely posttraumatic
with areas of moderate cartilage thinning, spurring changes and
joint effusion.

Subtalar Joints/Sinus Tarsi: Moderate subtalar joint degenerative
changes. There is some fluid/edema in the sinus tarsi but the
cervical and interosseous ligaments are intact and the spring
ligament is intact.

Bones: No stress fracture or AVN. Suspect some form of tarsal
coalition at the calcaneonavicular region and also possibly between
the cuboid and the navicular bone. Moderate midfoot degenerative
changes are noted. Is also dorsal spurring from the distal talus and
navicular bones.

Other: Unremarkable foot and ankle musculature.
IMPRESSION: 1. Moderate chronic appearing Achilles tendinopathy with small
interstitial tears mainly distally. No partial or full-thickness
tear/rupture.
2. Evidence of remote anterior talofibular ligament injury with an
old avulsion fracture from the distal fibula.
3. Evidence of remote plantar fasciitis or chronic plantar
fasciitis.
4. Age advanced tibiotalar, subtalar and midfoot degenerative
changes.
5. Suspect tarsal coalition at the calcaneonavicular region and
possibly between the cuboid and the navicular bone.
6. Moderate tenosynovitis involving the posterior tibialis tendon.
7. No stress fracture or AVN.

## 2020-10-25 ENCOUNTER — Other Ambulatory Visit: Payer: Self-pay

## 2020-10-25 ENCOUNTER — Encounter: Payer: Self-pay | Admitting: Nurse Practitioner

## 2020-10-25 ENCOUNTER — Ambulatory Visit: Payer: BC Managed Care – PPO | Admitting: Nurse Practitioner

## 2020-10-25 VITALS — BP 122/80 | Ht 65.0 in | Wt 287.0 lb

## 2020-10-25 DIAGNOSIS — R1031 Right lower quadrant pain: Secondary | ICD-10-CM | POA: Diagnosis not present

## 2020-10-25 NOTE — Progress Notes (Signed)
   Acute Office Visit  Subjective:    Patient ID: Lydia [name], female    DOB: 11/01/1961, 59 y.o.   MRN: [id number]   HPI 59 y.o. presents today for right lower abdominal pain that start about 5 days ago. Pain is described as crampy/achy and comes and goes. Nothing makes it worse. Tylenol helps. Postmenopausal. No vaginal bleeding. No urinary or vaginal symptoms. No changes in bowel habits. History of uterine fibroids.    Review of Systems  Constitutional: Negative.   Gastrointestinal: Positive for abdominal pain. Negative for constipation, diarrhea, nausea and vomiting.  Genitourinary: Negative.        Objective:    Physical Exam Constitutional:      Appearance: Normal appearance.  Abdominal:     Tenderness: There is no abdominal tenderness. There is no guarding. Negative signs include McBurney's sign.  Genitourinary:    General: Normal vulva.     Vagina: Normal.     BP 122/80 (BP Location: Right Arm, Patient Position: Standing, Cuff Size: Large)   Ht 5\' 5"  (1.651 m)   Wt 287 lb (130.2 kg)   LMP 09/07/2012   BMI 47.76 kg/m  Wt Readings from Last 3 Encounters:  10/25/20 287 lb (130.2 kg)  07/15/19 297 lb 6 oz (134.9 kg)  09/02/17 272 lb (123.4 kg)        Assessment & Plan:   Problem List Items Addressed This Visit    None    Visit Diagnoses    Right lower quadrant abdominal pain    -  Primary   Relevant Orders   US PELVIS TRANSVAGINAL NON-OB (TV ONLY)     Plan: We will schedule pelvic ultrasound for further evaluation. Tylenol/Ibuprofen as needed for pain. If pain becomes severe she will return or go to emergency room. She is agreeable to plan.      [name] [facility], 3:22 PM 10/25/2020

## 2020-11-22 ENCOUNTER — Ambulatory Visit: Payer: BC Managed Care – PPO | Admitting: Nurse Practitioner

## 2020-11-22 ENCOUNTER — Other Ambulatory Visit: Payer: BC Managed Care – PPO

## 2020-11-27 ENCOUNTER — Ambulatory Visit (INDEPENDENT_AMBULATORY_CARE_PROVIDER_SITE_OTHER): Payer: BC Managed Care – PPO | Admitting: Nurse Practitioner

## 2020-11-27 ENCOUNTER — Other Ambulatory Visit: Payer: Self-pay

## 2020-11-27 ENCOUNTER — Encounter: Payer: Self-pay | Admitting: Nurse Practitioner

## 2020-11-27 VITALS — BP 124/84 | Ht 64.0 in | Wt 291.0 lb

## 2020-11-27 DIAGNOSIS — Z01419 Encounter for gynecological examination (general) (routine) without abnormal findings: Secondary | ICD-10-CM

## 2020-11-27 NOTE — Addendum Note (Signed)
Addended by: Thamas Jaegers on: 11/27/2020 08:35 AM   Modules accepted: Orders

## 2020-11-27 NOTE — Progress Notes (Signed)
   Lydia [name] 12/27/1960 [id number]   History:  59 y.o. G1P0101 presents for annual exam without GYN complaints. Postmenopausal - no HRT, no bleeding. Normal pap and mammogram history. History of PCOS, T2DM, TIA, uterine fibroids. Smoker.   Gynecologic History Patient's last menstrual period was 09/07/2012.   Contraception: post menopausal status Last Pap: 05/2016. Results were: normal Last mammogram: 10/08/2020. Results were: normal Last colonoscopy: 2015 Last Dexa: Never  Past medical history, past surgical history, family history and social history were all reviewed and documented in the EPIC chart.  ROS:  A ROS was performed and pertinent positives and negatives are included.  Exam:  Vitals:   11/27/20 0810  BP: 124/84  Weight: 291 lb (132 kg)  Height: 5\' 4"  (1.626 m)   Body mass index is 49.95 kg/m.  General appearance:  Normal Thyroid:  Symmetrical, normal in size, without palpable masses or nodularity. Respiratory  Auscultation:  Clear without wheezing or rhonchi Cardiovascular  Auscultation:  Regular rate, without rubs, murmurs or gallops  Edema/varicosities:  Not grossly evident Abdominal  Soft,nontender, without masses, guarding or rebound.  Liver/spleen:  No organomegaly noted  Hernia:  None appreciated  Skin  Inspection:  Grossly normal   Breasts: Examined lying and sitting.   Right: Without masses, retractions, discharge or axillary adenopathy.   Left: Without masses, retractions, discharge or axillary adenopathy. Gentitourinary   Inguinal/mons:  Normal without inguinal adenopathy  External genitalia:  Normal  BUS/Urethra/Skene's glands:  Normal  Vagina:  Normal  Cervix:  Normal  Uterus:  Difficult to palpate due to body habitus but not gross masses or tenderness  Adnexa/parametria:     Rt: Without masses or tenderness.   Lt: Without masses or tenderness.  Anus and perineum: Normal  Digital rectal exam: Normal sphincter tone without palpated  masses or tenderness  Assessment/Plan:  58 y.o. G1P0101 for annual exam.   Well female exam with routine gynecological exam - Education provided on SBEs, importance of preventative screenings, current guidelines, high calcium diet, regular exercise, and multivitamin daily. Labs with PCP.   Screening for cervical cancer - Normal Pap history.  Pap with HR HPV today.  Screening for breast cancer - Normal mammogram history.  Continue annual screenings.  Normal breast exam today.  Screening for colon cancer - Will repeat at GI's recommended interval.   Follow up in 1 year for annual.     [name] [facility], 8:18 AM 11/27/2020

## 2020-11-27 NOTE — Patient Instructions (Signed)

## 2020-11-29 LAB — PAP, TP IMAGING W/ HPV RNA, RFLX HPV TYPE 16,18/45: HPV DNA High Risk: NOT DETECTED

## 2022-03-18 NOTE — Progress Notes (Signed)
? ?  Lydia [name] Aug 30, 1961 [id number] ? ? ?History:  61 y.o. G1P0101 presents for annual exam. Postmenopausal - no HRT, no bleeding. Normal pap and mammogram history. History of PCOS, T2DM, TIA, uterine fibroids. Smoker x 3 years, 1/2 ppd. Down 20 pounds since January with diet. Plans to have knee replacement once down to goal weight.  ? ?Gynecologic History ?Patient's last menstrual period was 09/07/2012. ?  ?Contraception: post menopausal status ?Sexually active: Yes ? ?Health maintenance ?Last Pap: 11/27/2020. Results were: Normal, 5-year repeat ?Last mammogram: 03/17/2022. Results were: Normal ?Last colonoscopy: 2015. Results were; Normal, 10-year recall ?Last Dexa: Never ? ?Past medical history, past surgical history, family history and social history were all reviewed and documented in the EPIC chart. 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  ? ?ROS:  A ROS was performed and pertinent positives and negatives are included. ? ?Exam: ? ?Vitals:  ? 03/19/22 1051  ?BP: 124/78  ?Weight: 250 lb (113.4 kg)  ?Height: 5' 3.5" (1.613 m)  ? ? ?Body mass index is 43.59 kg/m?. ? ?General appearance:  Normal ?Thyroid:  Symmetrical, normal in size, without palpable masses or nodularity. ?Respiratory ? Auscultation:  Clear without wheezing or rhonchi ?Cardiovascular ? Auscultation:  Regular rate, without rubs, murmurs or gallops ? Edema/varicosities:  Not grossly evident ?Abdominal ? Soft,nontender, without masses, guarding or rebound. ? Liver/spleen:  No organomegaly noted ? Hernia:  None appreciated ? Skin ? Inspection:  Grossly normal ?  ?Breasts: Examined lying and sitting.  ? Right: Without masses, retractions, discharge or axillary adenopathy. ? ? Left: Without masses, retractions, discharge or axillary adenopathy. ?Genitourinary  ? Inguinal/mons:  Normal without inguinal adenopathy ? External genitalia:  Normal appearing vulva with no masses, tenderness, or  lesions ? BUS/Urethra/Skene's glands:  Normal ? Vagina:  Normal appearing with normal color and discharge, no lesions ? Cervix:  Normal appearing without discharge or lesions ? Uterus:  Difficult to palpate due to body habitus but no gross masses or tenderness ? Adnexa/parametria:   ?  Rt: Normal in size, without masses or tenderness. ?  Lt: Normal in size, without masses or tenderness. ? Anus and perineum: Normal ? Digital rectal exam: Normal sphincter tone without palpated masses or tenderness ? ?Patient informed chaperone available to be present for breast and pelvic exam. Patient has requested no chaperone to be present. Patient has been advised what will be completed during breast and pelvic exam.  ? ?Assessment/Plan:  61 y.o. G1P0101 for annual exam.  ? ?Well female exam with routine gynecological exam - Education provided on SBEs, importance of preventative screenings, current guidelines, high calcium diet, regular exercise, and multivitamin daily. Labs with PCP. Congratulated on weight loss.  ? ?Postmenopausal - no HRT, no bleeding.  ? ?Screening for cervical cancer - Normal Pap history.  Will repeat at 5-year interval per guidelines.  ? ?Screening for breast cancer - Normal mammogram history.  Continue annual screenings.  Normal breast exam today. ? ?Screening for colon cancer - 2015 colonoscopy. Will repeat at GI's recommended interval.  ? ?Screening for osteoporosis - Average risk. Has smoked for about 3 years, diabetic. Exercising some, mostly upper body d/t knee pain (has plans for replacement surgery). Takes multivitamin. Will plan DXA at age 43.  ? ?Follow up in 1 year for annual.  ? ? ? ?[place], 11:01 AM 03/19/2022 ? ?

## 2022-03-19 ENCOUNTER — Encounter: Payer: Self-pay | Admitting: Nurse Practitioner

## 2022-03-19 ENCOUNTER — Ambulatory Visit (INDEPENDENT_AMBULATORY_CARE_PROVIDER_SITE_OTHER): Payer: BC Managed Care – PPO | Admitting: Nurse Practitioner

## 2022-03-19 VITALS — BP 124/78 | Ht 63.5 in | Wt 250.0 lb

## 2022-03-19 DIAGNOSIS — Z780 Asymptomatic menopausal state: Secondary | ICD-10-CM

## 2022-03-19 DIAGNOSIS — Z01419 Encounter for gynecological examination (general) (routine) without abnormal findings: Secondary | ICD-10-CM

## 2023-06-09 ENCOUNTER — Encounter: Payer: Self-pay | Admitting: Nurse Practitioner

## 2023-06-09 ENCOUNTER — Ambulatory Visit (INDEPENDENT_AMBULATORY_CARE_PROVIDER_SITE_OTHER): Payer: BC Managed Care – PPO | Admitting: Nurse Practitioner

## 2023-06-09 VITALS — BP 134/72 | HR 88 | Ht 63.39 in | Wt 276.0 lb

## 2023-06-09 DIAGNOSIS — L989 Disorder of the skin and subcutaneous tissue, unspecified: Secondary | ICD-10-CM | POA: Diagnosis not present

## 2023-06-09 DIAGNOSIS — L293 Anogenital pruritus, unspecified: Secondary | ICD-10-CM | POA: Diagnosis not present

## 2023-06-09 DIAGNOSIS — Z01419 Encounter for gynecological examination (general) (routine) without abnormal findings: Secondary | ICD-10-CM

## 2023-06-09 DIAGNOSIS — Z780 Asymptomatic menopausal state: Secondary | ICD-10-CM | POA: Diagnosis not present

## 2023-06-09 NOTE — Progress Notes (Signed)
Lydia [name] 1961/03/07 [id number]   History:  62 y.o. G1P0101 presents for annual exam. Postmenopausal - no HRT, no bleeding. Normal pap and mammogram history. History of PCOS, T2DM, TIA, uterine fibroids. Smoker. Has spot on right posterior forearm that has been scabby/dry for a long time. Would like dermatology recommendation. Previous provided moved.   Gynecologic History Patient's last menstrual period was 09/07/2012.   Contraception: post menopausal status Sexually active: Yes  Health maintenance Last Pap: 11/27/2020. Results were: Normal neg HPV, 5-year repeat Last mammogram: 04/17/2023. Results were: Normal Last colonoscopy: 2015. Results were; Normal, 10-year recall Last Dexa: Never  Past medical history, past surgical history, family history and social history were all reviewed and documented in the EPIC chart. 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   ROS:  A ROS was performed and pertinent positives and negatives are included.  Exam:  Vitals:   06/09/23 1035  BP: 134/72  Pulse: 88  SpO2: 100%  Weight: 276 lb (125.2 kg)  Height: 5' 3.39" (1.61 m)    Body mass index is 48.3 kg/m.  General appearance:  Normal Thyroid:  Symmetrical, normal in size, without palpable masses or nodularity. Respiratory  Auscultation:  Clear without wheezing or rhonchi Cardiovascular  Auscultation:  Regular rate, without rubs, murmurs or gallops  Edema/varicosities:  Not grossly evident Abdominal  Soft,nontender, without masses, guarding or rebound.  Liver/spleen:  No organomegaly noted  Hernia:  None appreciated  Skin  Inspection:  ~5 mm dry, scabby lesion right posterior forearm close to wrist   Breasts: Examined lying and sitting.   Right: Without masses, retractions, discharge or axillary adenopathy.   Left: Without masses, retractions, discharge or axillary adenopathy. Genitourinary   Inguinal/mons:  Normal without inguinal  adenopathy  External genitalia:  Normal appearing vulva with no masses, tenderness, or lesions.  BUS/Urethra/Skene's glands:  Normal  Vagina:  Normal appearing with normal color and discharge, no lesions  Cervix:  Normal appearing without discharge or lesions  Uterus:  Difficult to palpate due to body habitus but no gross masses or tenderness  Adnexa/parametria:     Rt: Normal in size, without masses or tenderness.   Lt: Normal in size, without masses or tenderness.  Anus and perineum: Mild perineal redness from excoriation  Digital rectal exam: Deferred  Patient informed chaperone available to be present for breast and pelvic exam. Patient has requested no chaperone to be present. Patient has been advised what will be completed during breast and pelvic exam.   Assessment/Plan:  62 y.o. G1P0101 for annual exam.   Well female exam with routine gynecological exam - Education provided on SBEs, importance of preventative screenings, current guidelines, high calcium diet, regular exercise, and multivitamin daily. Labs with PCP.  Postmenopausal - no HRT, no bleeding.   Skin lesion of right arm - ~5 mm dry, scabby lesion. Local dermatology offices provided.   Perineal irritation - redness from rubbing/friction. Recommend barrier cream such as Aquafor, A&D, Desitin. No itchy, burns occasionally.   Screening for cervical cancer - Normal Pap history.  Will repeat at 5-year interval per guidelines.   Screening for breast cancer - Normal mammogram history.  Continue annual screenings.  Normal breast exam today.  Screening for colon cancer - 2015 colonoscopy. Will repeat at GI's recommended interval.   Screening for osteoporosis - Smoker. Unable to exercise due to knee pain. Recommend DXA prior to age 50 due to risks.   Follow up in 1  year for annual.       Olivia Mackie [facility], 11:03 AM 06/09/2023

## 2023-07-29 ENCOUNTER — Encounter: Payer: Self-pay | Admitting: Nurse Practitioner

## 2023-07-29 ENCOUNTER — Ambulatory Visit: Payer: BC Managed Care – PPO | Admitting: Nurse Practitioner

## 2023-07-29 VITALS — BP 126/82 | HR 75 | Wt 270.8 lb

## 2023-07-29 DIAGNOSIS — M7918 Myalgia, other site: Secondary | ICD-10-CM

## 2023-07-29 DIAGNOSIS — R102 Pelvic and perineal pain: Secondary | ICD-10-CM | POA: Diagnosis not present

## 2023-07-29 MED ORDER — MELOXICAM 7.5 MG PO TABS
7.5000 mg | ORAL_TABLET | Freq: Every day | ORAL | 0 refills | Status: AC
Start: 2023-07-29 — End: ?

## 2023-07-29 NOTE — Progress Notes (Signed)
   Acute Office Visit  Subjective:    Patient ID: Lydia [name], female    DOB: [birth year], 62 y.o.   MRN: [id number]   HPI 62 y.o. presents today for left lower abdominal/hip pain x 3 weeks. 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 Has been active and moving furniture some since. Pain is dull, worse with activity, unchanged. Saw PCP 07/20/23 and thought to be musculoskeletal. NSAIDs, muscle relaxer recommended. Muscle relaxer makes her sleepy. 2017 umbilical hernia repair. No change in bowels, no urinary or vaginal symptoms.   Patient's last menstrual period was 09/07/2012.    Review of Systems  Constitutional: Negative.   Gastrointestinal:  Negative for abdominal distention, constipation, diarrhea, nausea and vomiting.  Genitourinary:  Negative for dysuria, frequency, hematuria, urgency, vaginal bleeding, vaginal discharge and vaginal pain.  Musculoskeletal:  Positive for myalgias.       Objective:    Physical Exam Constitutional:      Appearance: Normal appearance.  Abdominal:     Tenderness: There is abdominal tenderness. There is no guarding or rebound.     Hernia: There is no hernia in the left inguinal area or left femoral area.    Genitourinary:    General: Normal vulva.     Uterus: Normal.      Adnexa: Right adnexa normal and left adnexa normal.     BP 126/82   Pulse 75   Wt 270 lb 12.8 oz (122.8 kg)   LMP 09/07/2012   SpO2 100%   BMI 47.39 kg/m  Wt Readings from Last 3 Encounters:  07/29/23 270 lb 12.8 oz (122.8 kg)  06/09/23 276 lb (125.2 kg)  03/19/22 250 lb (113.4 kg)        Patient informed chaperone available to be present for breast and/or pelvic exam. Patient has requested no chaperone to be present. Patient has been advised what will be completed during breast and pelvic exam.   Assessment & Plan:   Problem List Items Addressed This Visit   None Visit Diagnoses     Musculoskeletal pain    -  Primary   Relevant  Medications   meloxicam (MOBIC) 7.5 MG tablet      Plan: Pain appears to be musculoskeletal in nature. Recommend rest, no moving furniture until symptoms resolve, will switch to Meloxicam daily, may take Flexeril at night due to drowsiness. If no improvement in 2-3 weeks she will follow up with PCP for possible imaging.      Olivia Mackie DNP, 3:49 PM 07/29/2023

## 2023-08-05 ENCOUNTER — Ambulatory Visit: Payer: BC Managed Care – PPO | Admitting: Nurse Practitioner

## 2024-10-12 ENCOUNTER — Ambulatory Visit: Admitting: Nurse Practitioner

## 2024-10-12 NOTE — Progress Notes (Deleted)
   Lydia [name] 02/02/1961 [id number]   History:  63 y.o. G1P0101 presents for annual exam. Postmenopausal - no HRT, no bleeding. Normal pap history. History of PCOS, T2DM, TIA, uterine fibroids. Smoker.   Gynecologic History Patient's last menstrual period was 09/07/2012.   Contraception: post menopausal status Sexually active: Yes  Health maintenance Last Pap: 11/27/2020. Results were: Normal neg HPV Last mammogram: 05/20/2024. Results were: Normal Last colonoscopy: 2015. Results were; Normal, 10-year recall Last Dexa: Never      No data to display           Past medical history, past surgical history, family history and social history were all reviewed and documented in the EPIC chart. 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   ROS:  A ROS was performed and pertinent positives and negatives are included.  Exam:  There were no vitals filed for this visit.   There is no height or weight on file to calculate BMI.  General appearance:  Normal Thyroid :  Symmetrical, normal in size, without palpable masses or nodularity. Respiratory  Auscultation:  Clear without wheezing or rhonchi Cardiovascular  Auscultation:  Regular rate, without rubs, murmurs or gallops  Edema/varicosities:  Not grossly evident Abdominal  Soft,nontender, without masses, guarding or rebound.  Liver/spleen:  No organomegaly noted  Hernia:  None appreciated  Skin  Inspection: Grossly normal Breasts: Examined lying and sitting.   Right: Without masses, retractions, discharge or axillary adenopathy.   Left: Without masses, retractions, discharge or axillary adenopathy. Pelvic: External genitalia:  no lesions              Urethra:  normal appearing urethra with no masses, tenderness or lesions              Bartholins and Skenes: normal                 Vagina: normal appearing vagina with normal color and discharge, no lesions              Cervix: no  lesions Bimanual Exam:  Uterus:  no masses or tenderness              Adnexa: no mass, fullness, tenderness              Rectovaginal: Deferred              Anus:  normal, no lesions   Assessment/Plan:  63 y.o. G1P0101 for annual exam.   Well female exam with routine gynecological exam - Education provided on SBEs, importance of preventative screenings, current guidelines, high calcium  diet, regular exercise, and multivitamin daily. Labs with PCP.  Postmenopausal - no HRT, no bleeding.   Screening for cervical cancer - Normal Pap history.  Will repeat at 5-year interval per guidelines.   Screening for breast cancer - Normal mammogram history.  Continue annual screenings.  Normal breast exam today.  Screening for colon cancer - 2015 colonoscopy. Will repeat at GI's recommended interval.   Screening for osteoporosis - Smoker. Unable to exercise due to knee pain. Recommend DXA prior to age 53 due to risks.   No follow-ups on file.       Lydia [name] [facility], 11:11 AM 10/12/2024
